# Patient Record
Sex: Female | Born: 1985 | Race: Black or African American | Hispanic: No | Marital: Single | Smoking: Never smoker
Health system: Southern US, Community
[De-identification: ages and names within clinical notes are randomized; demographics above are authoritative.]

## PROBLEM LIST (undated history)

## (undated) DIAGNOSIS — A568 Sexually transmitted chlamydial infection of other sites: Secondary | ICD-10-CM

## (undated) DIAGNOSIS — O98319 Other infections with a predominantly sexual mode of transmission complicating pregnancy, unspecified trimester: Secondary | ICD-10-CM

## (undated) DIAGNOSIS — IMO0002 Reserved for concepts with insufficient information to code with codable children: Secondary | ICD-10-CM

## (undated) DIAGNOSIS — R87619 Unspecified abnormal cytological findings in specimens from cervix uteri: Secondary | ICD-10-CM

## (undated) DIAGNOSIS — D649 Anemia, unspecified: Secondary | ICD-10-CM

## (undated) HISTORY — DX: Unspecified abnormal cytological findings in specimens from cervix uteri: R87.619

## (undated) HISTORY — DX: Sexually transmitted chlamydial infection of other sites: A56.8

## (undated) HISTORY — DX: Reserved for concepts with insufficient information to code with codable children: IMO0002

## (undated) HISTORY — DX: Anemia, unspecified: D64.9

## (undated) HISTORY — DX: Other infections with a predominantly sexual mode of transmission complicating pregnancy, unspecified trimester: O98.319

---

## 2008-07-30 ENCOUNTER — Ambulatory Visit: Payer: Self-pay | Admitting: Obstetrics and Gynecology

## 2008-07-30 ENCOUNTER — Encounter: Payer: Self-pay | Admitting: Family Medicine

## 2008-07-30 LAB — CONVERTED CEMR LAB
Antibody Screen: NEGATIVE
Eosinophils Relative: 5 % (ref 0–5)
Hemoglobin: 13.5 g/dL (ref 12.0–15.0)
Hgb A2 Quant: 2.8 % (ref 2.2–3.2)
Hgb A: 97.2 % (ref 96.8–97.8)
Hgb F Quant: 0 % (ref 0.0–2.0)
Hgb S Quant: 0 % (ref 0.0–0.0)
Monocytes Relative: 9 % (ref 3–12)
Platelets: 385 10*3/uL (ref 150–400)
RBC: 4.31 M/uL (ref 3.87–5.11)
WBC: 6.7 10*3/uL (ref 4.0–10.5)

## 2008-07-31 ENCOUNTER — Ambulatory Visit (HOSPITAL_COMMUNITY): Admission: RE | Admit: 2008-07-31 | Discharge: 2008-07-31 | Payer: Self-pay | Admitting: Obstetrics & Gynecology

## 2008-08-25 ENCOUNTER — Other Ambulatory Visit: Admission: RE | Admit: 2008-08-25 | Discharge: 2008-08-25 | Payer: Self-pay | Admitting: Obstetrics & Gynecology

## 2008-08-25 ENCOUNTER — Encounter: Payer: Self-pay | Admitting: Obstetrics & Gynecology

## 2008-08-25 ENCOUNTER — Ambulatory Visit: Payer: Self-pay | Admitting: Obstetrics & Gynecology

## 2008-08-25 DIAGNOSIS — A568 Sexually transmitted chlamydial infection of other sites: Secondary | ICD-10-CM

## 2008-08-25 HISTORY — DX: Sexually transmitted chlamydial infection of other sites: A56.8

## 2008-08-26 ENCOUNTER — Encounter: Payer: Self-pay | Admitting: Family Medicine

## 2008-08-26 LAB — CONVERTED CEMR LAB
Trich, Wet Prep: NONE SEEN
Yeast Wet Prep HPF POC: NONE SEEN

## 2008-09-09 ENCOUNTER — Encounter: Payer: Self-pay | Admitting: Family Medicine

## 2008-09-09 ENCOUNTER — Ambulatory Visit: Payer: Self-pay | Admitting: Obstetrics and Gynecology

## 2008-09-22 ENCOUNTER — Ambulatory Visit (HOSPITAL_COMMUNITY): Admission: RE | Admit: 2008-09-22 | Discharge: 2008-09-22 | Payer: Self-pay | Admitting: Obstetrics & Gynecology

## 2008-09-22 ENCOUNTER — Ambulatory Visit: Payer: Self-pay | Admitting: Obstetrics and Gynecology

## 2008-09-22 IMAGING — US US OB DETAIL+14 WK
1 series · 14 of 28 positions shown · non-contrast
Comparison: none

OBSTETRICAL ULTRASOUND:
 This ultrasound exam was performed in the [HOSPITAL] Ultrasound Department.  The OB US report was generated in the AS system, and faxed to the ordering physician.  This report is also available in [REDACTED] PACS.

[Series 1: us ob detail +14 wk · 14 of 91 slices shown]
[im 4/91]
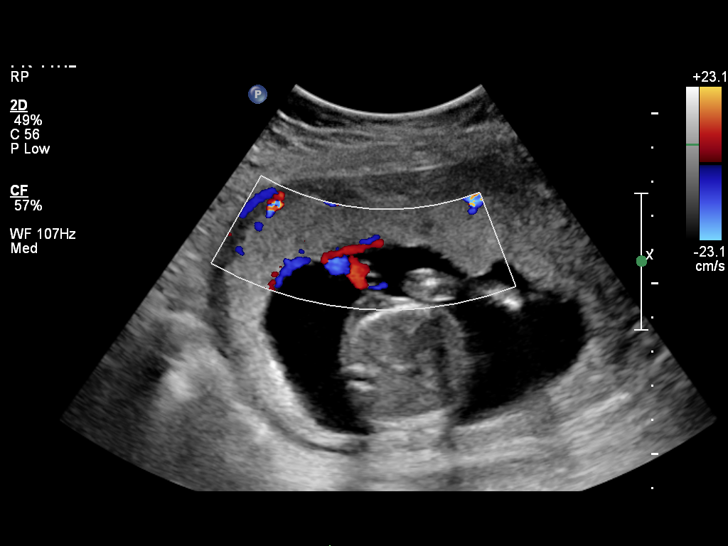
[im 11/91]
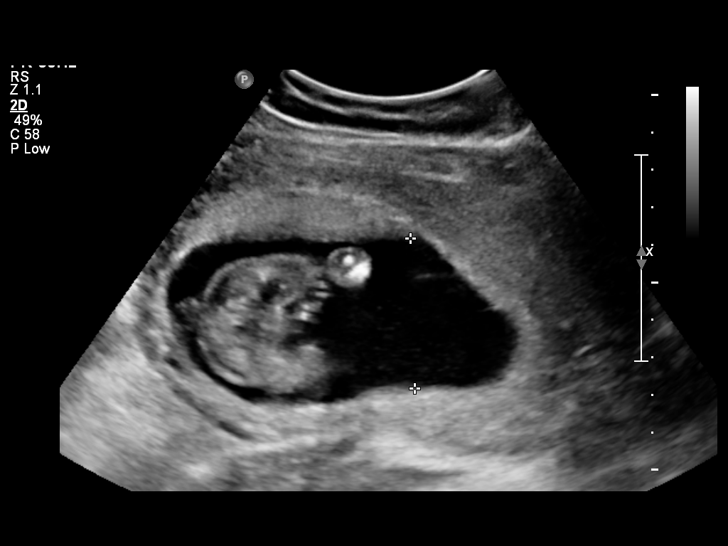
[im 17/91]
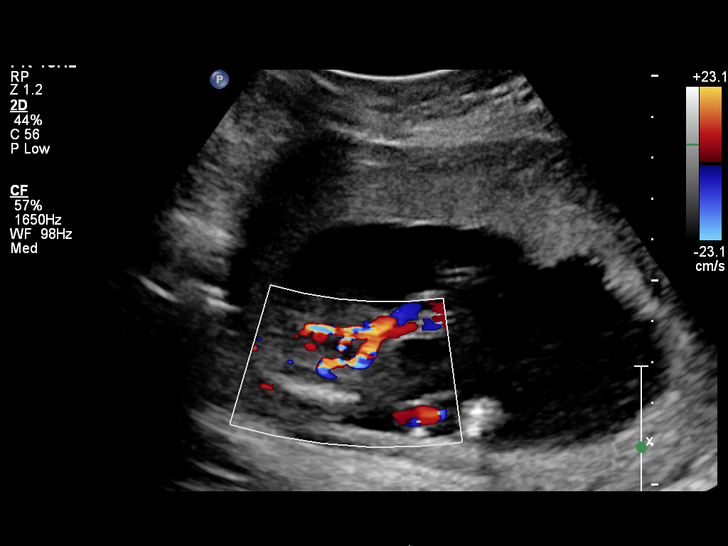
[im 24/91]
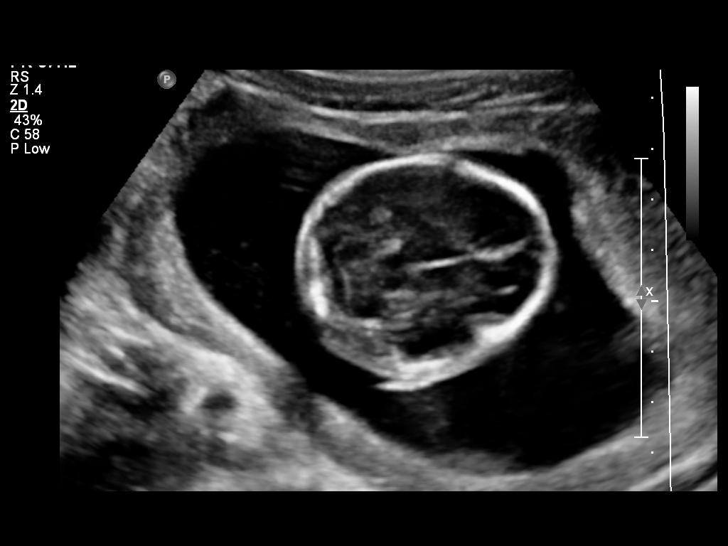
[im 31/91]
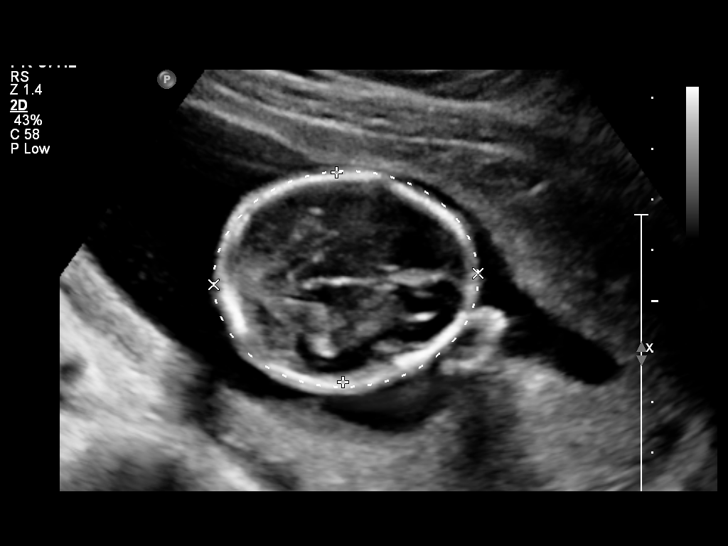
[im 37/91]
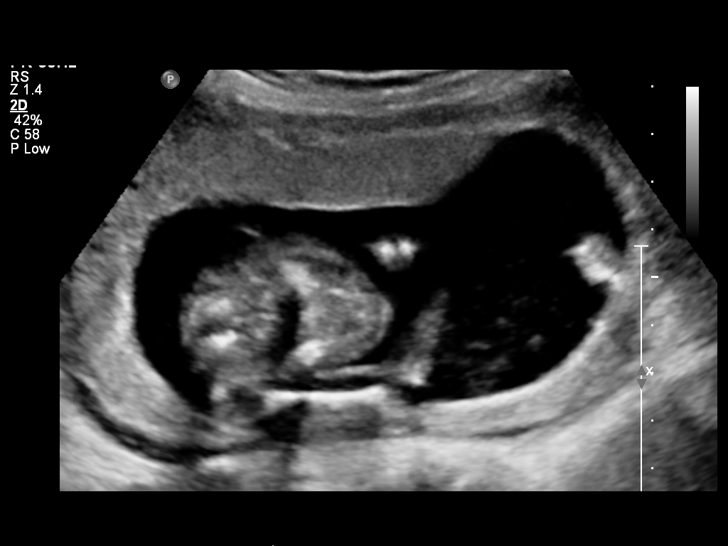
[im 44/91]
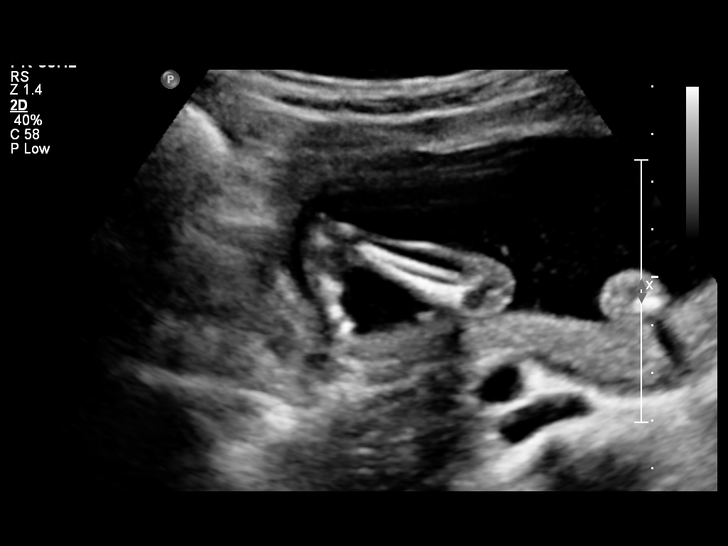
[im 51/91]
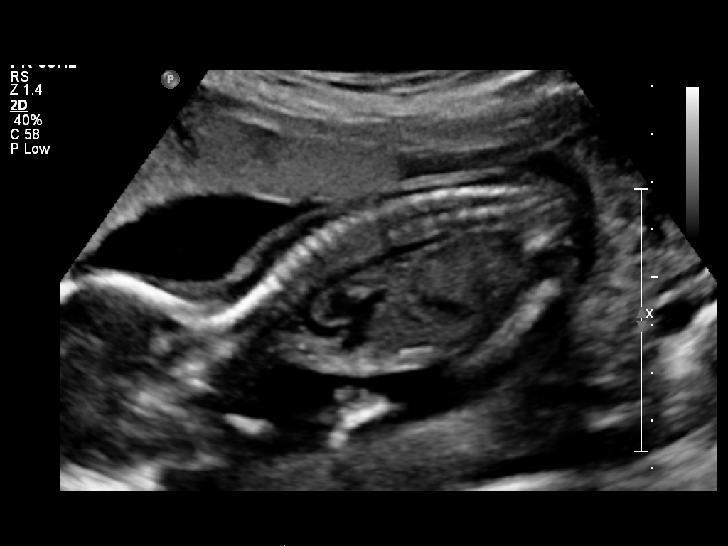
[im 57/91]
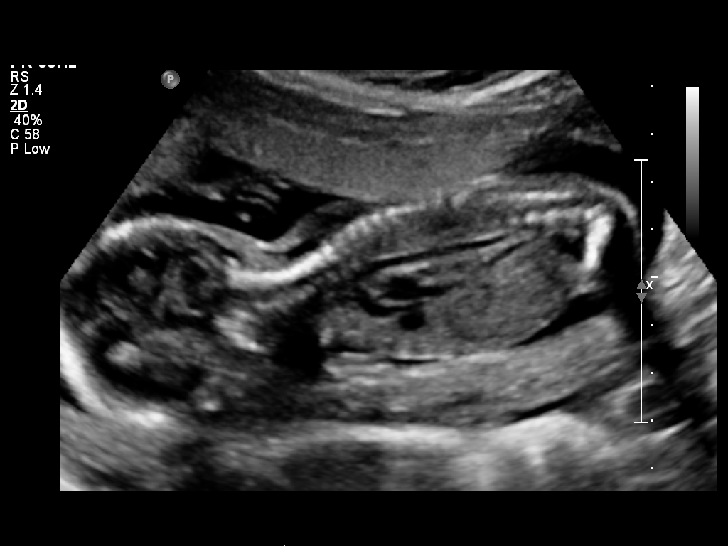
[im 64/91]
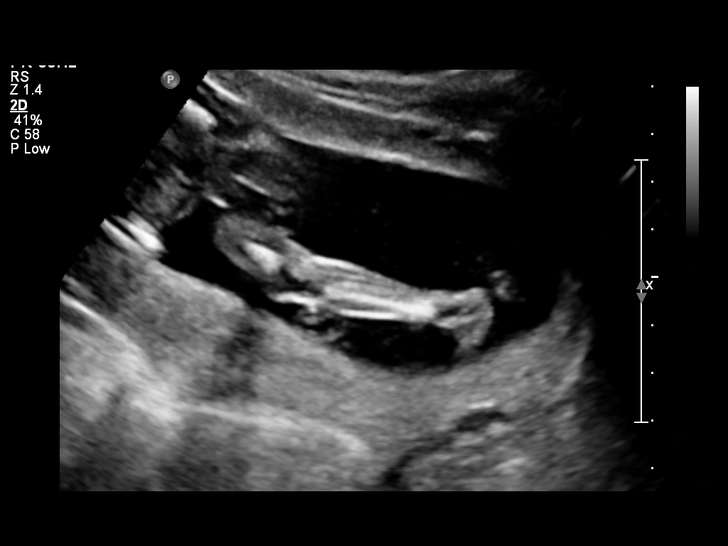
[im 71/91]
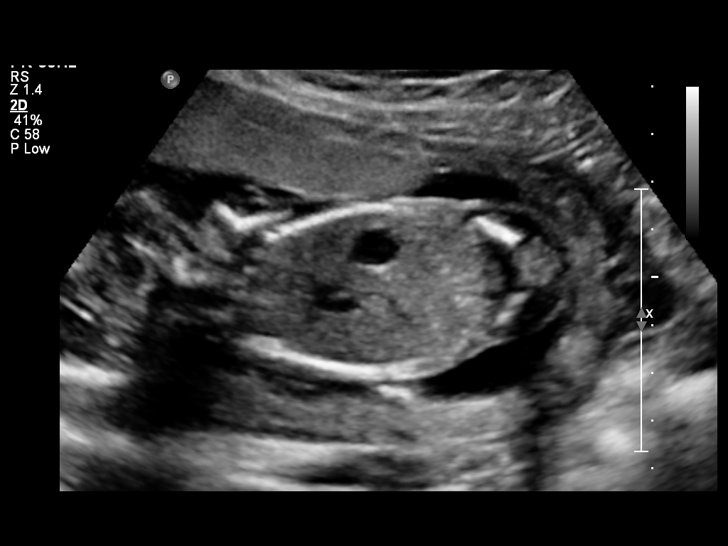
[im 77/91]
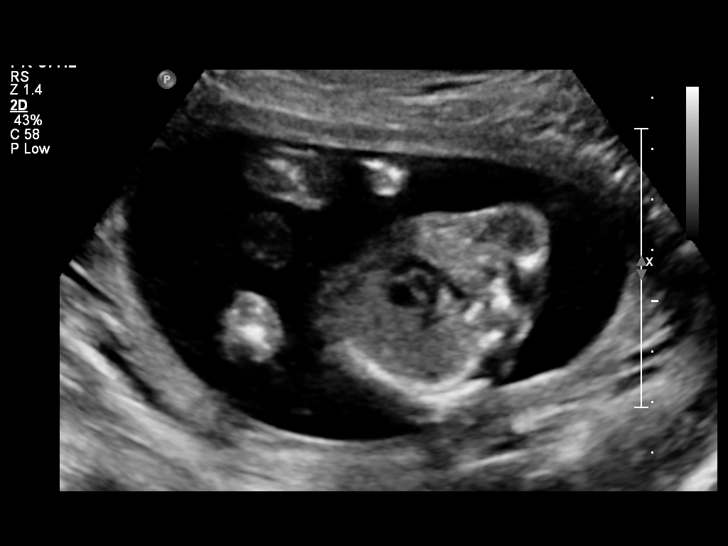
[im 84/91]
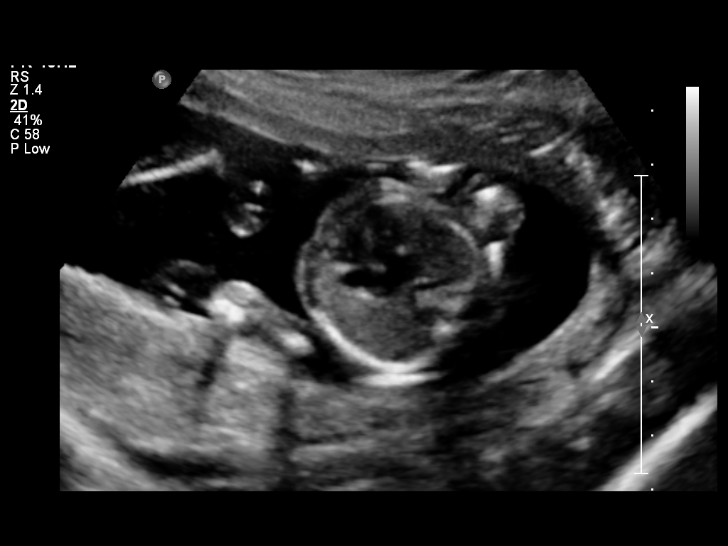
[im 91/91]
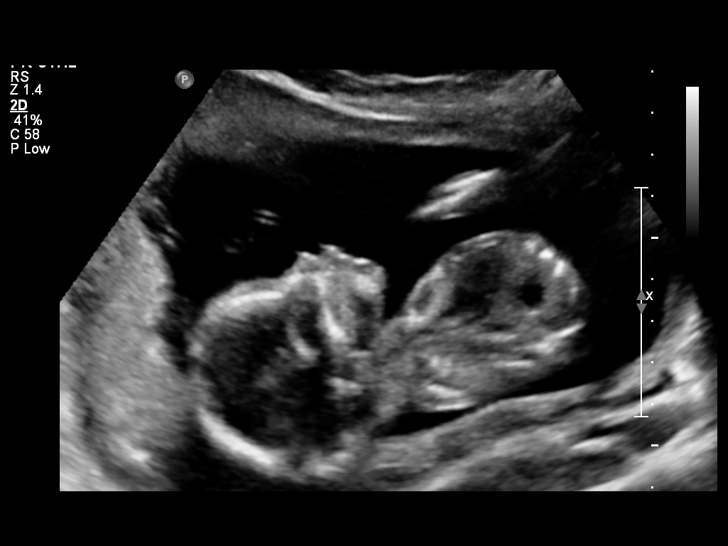

[14 of 28 positions shown; findings below may reference images not displayed]

IMPRESSION: See AS Obstetric US report.

## 2008-09-29 ENCOUNTER — Ambulatory Visit (HOSPITAL_COMMUNITY): Admission: RE | Admit: 2008-09-29 | Discharge: 2008-09-29 | Payer: Self-pay | Admitting: Obstetrics & Gynecology

## 2008-10-10 ENCOUNTER — Emergency Department: Payer: Self-pay | Admitting: Emergency Medicine

## 2008-10-16 ENCOUNTER — Ambulatory Visit (HOSPITAL_COMMUNITY): Admission: RE | Admit: 2008-10-16 | Discharge: 2008-10-16 | Payer: Self-pay | Admitting: Obstetrics & Gynecology

## 2008-10-20 ENCOUNTER — Ambulatory Visit: Payer: Self-pay | Admitting: Obstetrics and Gynecology

## 2008-11-13 ENCOUNTER — Ambulatory Visit (HOSPITAL_COMMUNITY): Admission: RE | Admit: 2008-11-13 | Discharge: 2008-11-13 | Payer: Self-pay | Admitting: Obstetrics & Gynecology

## 2008-11-17 ENCOUNTER — Ambulatory Visit: Payer: Self-pay | Admitting: Family Medicine

## 2008-12-01 ENCOUNTER — Ambulatory Visit: Payer: Self-pay | Admitting: Obstetrics & Gynecology

## 2008-12-01 ENCOUNTER — Encounter: Payer: Self-pay | Admitting: Family Medicine

## 2008-12-01 LAB — CONVERTED CEMR LAB
HCT: 36.2 % (ref 36.0–46.0)
Hemoglobin: 12 g/dL (ref 12.0–15.0)
MCV: 95.5 fL (ref 78.0–100.0)
Platelets: 281 10*3/uL (ref 150–400)
WBC: 10.2 10*3/uL (ref 4.0–10.5)

## 2008-12-11 ENCOUNTER — Ambulatory Visit (HOSPITAL_COMMUNITY): Admission: RE | Admit: 2008-12-11 | Discharge: 2008-12-11 | Payer: Self-pay | Admitting: Obstetrics & Gynecology

## 2008-12-23 ENCOUNTER — Ambulatory Visit: Payer: Self-pay | Admitting: Obstetrics and Gynecology

## 2009-01-06 ENCOUNTER — Ambulatory Visit: Payer: Self-pay | Admitting: Obstetrics and Gynecology

## 2009-01-08 ENCOUNTER — Ambulatory Visit (HOSPITAL_COMMUNITY): Admission: RE | Admit: 2009-01-08 | Discharge: 2009-01-08 | Payer: Self-pay | Admitting: Obstetrics & Gynecology

## 2009-01-27 ENCOUNTER — Ambulatory Visit: Payer: Self-pay | Admitting: Obstetrics & Gynecology

## 2009-01-27 ENCOUNTER — Encounter: Payer: Self-pay | Admitting: Family Medicine

## 2009-01-27 LAB — CONVERTED CEMR LAB: GC Probe Amp, Genital: NEGATIVE

## 2009-01-28 ENCOUNTER — Encounter: Payer: Self-pay | Admitting: Family Medicine

## 2009-02-03 ENCOUNTER — Ambulatory Visit: Payer: Self-pay | Admitting: Obstetrics and Gynecology

## 2009-02-05 ENCOUNTER — Inpatient Hospital Stay (HOSPITAL_COMMUNITY): Admission: AD | Admit: 2009-02-05 | Discharge: 2009-02-05 | Payer: Self-pay | Admitting: Obstetrics & Gynecology

## 2009-02-10 ENCOUNTER — Ambulatory Visit: Payer: Self-pay | Admitting: Family Medicine

## 2009-02-18 ENCOUNTER — Ambulatory Visit: Payer: Self-pay | Admitting: Obstetrics & Gynecology

## 2009-02-18 ENCOUNTER — Inpatient Hospital Stay (HOSPITAL_COMMUNITY): Admission: RE | Admit: 2009-02-18 | Discharge: 2009-02-21 | Payer: Self-pay | Admitting: Obstetrics & Gynecology

## 2009-03-08 ENCOUNTER — Emergency Department: Payer: Self-pay | Admitting: Emergency Medicine

## 2009-03-10 ENCOUNTER — Ambulatory Visit: Payer: Self-pay | Admitting: Obstetrics and Gynecology

## 2009-04-01 ENCOUNTER — Other Ambulatory Visit: Admission: RE | Admit: 2009-04-01 | Discharge: 2009-04-01 | Payer: Self-pay | Admitting: Obstetrics and Gynecology

## 2009-04-01 ENCOUNTER — Encounter: Payer: Self-pay | Admitting: Obstetrics and Gynecology

## 2009-04-01 ENCOUNTER — Ambulatory Visit: Payer: Self-pay | Admitting: Obstetrics and Gynecology

## 2009-04-01 LAB — CONVERTED CEMR LAB
Platelets: 332 10*3/uL (ref 150–400)
RBC: 4.24 M/uL (ref 3.87–5.11)
RDW: 12.4 % (ref 11.5–15.5)

## 2009-05-17 ENCOUNTER — Ambulatory Visit: Payer: Self-pay | Admitting: Obstetrics and Gynecology

## 2009-08-10 ENCOUNTER — Ambulatory Visit: Payer: Self-pay | Admitting: Obstetrics and Gynecology

## 2009-11-01 ENCOUNTER — Ambulatory Visit: Payer: Self-pay | Admitting: Obstetrics and Gynecology

## 2010-01-24 ENCOUNTER — Ambulatory Visit: Payer: Self-pay | Admitting: Obstetrics and Gynecology

## 2010-04-25 ENCOUNTER — Other Ambulatory Visit: Admission: RE | Admit: 2010-04-25 | Discharge: 2010-04-25 | Payer: Self-pay | Admitting: Obstetrics and Gynecology

## 2010-04-25 ENCOUNTER — Ambulatory Visit: Payer: Self-pay | Admitting: Obstetrics and Gynecology

## 2010-07-25 ENCOUNTER — Ambulatory Visit: Payer: Self-pay | Admitting: Obstetrics and Gynecology

## 2010-10-17 ENCOUNTER — Ambulatory Visit: Admit: 2010-10-17 | Payer: Self-pay | Admitting: Obstetrics and Gynecology

## 2010-10-20 ENCOUNTER — Ambulatory Visit: Payer: Medicaid Other

## 2010-10-20 ENCOUNTER — Ambulatory Visit: Payer: Self-pay

## 2010-10-20 DIAGNOSIS — Z3049 Encounter for surveillance of other contraceptives: Secondary | ICD-10-CM

## 2010-12-26 LAB — CBC
HCT: 32.1 % — ABNORMAL LOW (ref 36.0–46.0)
Hemoglobin: 11.4 g/dL — ABNORMAL LOW (ref 12.0–15.0)
MCHC: 34.9 g/dL (ref 30.0–36.0)
MCHC: 35.6 g/dL (ref 30.0–36.0)
MCV: 99.2 fL (ref 78.0–100.0)
Platelets: 190 10*3/uL (ref 150–400)
Platelets: 201 10*3/uL (ref 150–400)
RDW: 13 % (ref 11.5–15.5)
WBC: 10.5 10*3/uL (ref 4.0–10.5)
WBC: 7.1 10*3/uL (ref 4.0–10.5)

## 2010-12-26 LAB — TYPE AND SCREEN

## 2010-12-26 LAB — RPR: RPR Ser Ql: NONREACTIVE

## 2010-12-27 LAB — URINALYSIS, ROUTINE W REFLEX MICROSCOPIC
Bilirubin Urine: NEGATIVE
Glucose, UA: NEGATIVE mg/dL
Hgb urine dipstick: NEGATIVE
Ketones, ur: NEGATIVE mg/dL
Nitrite: NEGATIVE
Protein, ur: NEGATIVE mg/dL
Specific Gravity, Urine: 1.015 (ref 1.005–1.030)
Urobilinogen, UA: 2 mg/dL — ABNORMAL HIGH (ref 0.0–1.0)
pH: 7 (ref 5.0–8.0)

## 2010-12-27 LAB — WET PREP, GENITAL
Trich, Wet Prep: NONE SEEN
Yeast Wet Prep HPF POC: NONE SEEN

## 2010-12-27 LAB — URINE MICROSCOPIC-ADD ON

## 2011-01-09 ENCOUNTER — Ambulatory Visit: Payer: Medicaid Other

## 2011-01-09 DIAGNOSIS — Z3049 Encounter for surveillance of other contraceptives: Secondary | ICD-10-CM

## 2011-01-31 NOTE — Op Note (Signed)
NAMEBRAYLEIGH, Monique Stone              ACCOUNT NO.:  [id number]   MEDICAL RECORD NO.:  [id number]          PATIENT TYPE:  INP   LOCATION:  9120                          FACILITY:  [facility]   PHYSICIAN:  Lesly Dukes, M.D. DATE OF BIRTH:  06-17-1986   DATE OF PROCEDURE:  02/18/2009  DATE OF DISCHARGE:                               OPERATIVE REPORT   PREOPERATIVE DIAGNOSES:  1. Intrauterine pregnancy at 39-0/7 weeks' gestational age.  2. History of previous cesarean section.   POSTOPERATIVE DIAGNOSES:  1. Intrauterine pregnancy at 39-0/7 weeks' gestational age.  2. History of previous cesarean section.   PROCEDURE:  Repeat low transverse cesarean section.   SURGEON:  Lesly Dukes, MD   ASSISTANT:  Odie Sera, DO   ANESTHESIA:  Spinal.   INDICATIONS FOR PROCEDURE:  Ms. Monique Stone is a 25 year old gravida  2 now para 2-0-0-2 at 39-0/7 weeks with history of previous cesarean.  He has previously been counseled on risks and benefits of repeat  cesarean section to include but not limited to bleeding, infection, and  damage to intra-abdominal organs.  The patient voiced understanding  these risks and desires to proceed with surgery.   DESCRIPTION OF PROCEDURE:  The patient was taken to operating room where  spinal anesthesia was introduced.  She was then prepped and draped in  the usual sterile manner and placed in left dorsal supine position.  Time-out was conducted.  Appropriate anesthesia was confirmed.  A  Pfannenstiel incision was made in the skin and extended through the  subcutaneous layers down to the fascia.  The fascia was then incised in  the midline.  The fascial incision was extended laterally using the Mayo  scissors.  The fascia was then bluntly and sharply dissected off the  underlying rectus muscles.  The rectus muscles were then separated in  the midline and the opening was extended using manual traction.  The  peritoneal opening was extended using  electrocautery and peritoneal  layer.  The left rectus muscles were also dissected bluntly with  electrocautery at the midline position approximately one-quarter the way  across.  Several omental adhesions were noted to the anterior abdominal  wall.  These were clamped with Kelly clamp and ligated with sutures and  then sharply dissected.  An appropriate opening to the uterus was then  obtained and the bladder blade was placed.  A transverse incision was  made in the lower uterine segment with the scalpel extended through the  myometrial layers.  The bulging membranes were noted.  The uterine  incision was then extended laterally using manual traction.  The  membranes ruptured bluntly and meconium-stained amniotic fluid was  noted.  The fetal head was grasped and elevated out of the pelvis.  The  bladder blade was removed and fetal head was delivered with the  assistance of fundal pressure.  The mouth and nares were then bulb  suctioned and the shoulders followed by the rest of corpus were  delivered without difficulty.  The cord was then clamped and cut and the  baby handed to the awaiting NICU staff with  a spontaneous cry, good  color, and good tone.  The placenta was then delivered with the  assistance of fundal massage.  The placenta was intact and had 3-vessel  cord.  The uterus was then cleared of clots and debris using dry lap  sponge.  The uterine incision was then closed using 0 Vicryl in a  running interlocking fashion.  A figure-of-eight suture was needed for  some persistent oozing.  Good hemostasis was noted of the uterine  incision.  Both fallopian tubes and ovaries were identified and found to  be grossly normal.  The peritoneum was then closed using 0 Vicryl in a  running non interlocking fashion.  The fascia was then closed using 0  Vicryl in a running noninterlocking fashion.  Good closure of the fascia  was noted and no defects were noted.  There was slight oozing of   subcutaneous layers which were treated with electrocautery.  Then, good  hemostasis was noted.  The subcutaneous tissue was irrigated with wet  lap sponge.  The skin was then closed with staples in the usual manner.  Pressure dressing was applied.   FINDINGS:  1. Meconium-stained amniotic fluid.  2. Viable female infant.  3. Grossly normal fallopian tubes and ovaries bilaterally.   SPECIMEN:  Placenta.   DISPOSITION:  To Labor and Delivery.   ESTIMATED BLOOD LOSS:  700 mL.   There were no immediate complications.  The patient was taken to PACU in  good condition.  All sponge, needle, and instrument counts were correct  x2.      Odie Sera, DO  Electronically Signed     ______________________________  Lesly Dukes, M.D.    MC/MEDQ  D:  02/18/2009  T:  02/19/2009  Job:  [id number]

## 2011-01-31 NOTE — Assessment & Plan Note (Signed)
Monique Stone, Monique Stone              ACCOUNT NO.:  [id number]   MEDICAL RECORD NO.:  [id number]          PATIENT TYPE:  POB   LOCATION:  [facility]         FACILITY:  [facility]   PHYSICIAN:  Argentina Donovan, MD        DATE OF BIRTH:  10/03/1985   DATE OF SERVICE:  04/25/2010                                  CLINIC NOTE   The patient is a 25 year old African American female gravida 2, para 2-0-  0-2 with a child 63-year-old who came in for her annual GYN examination  and her shot of Depo-Provera.  She has been on Depo-Provera since the  birth of her baby a year ago, has been encouraged to take calcium with  vitamin D, has had no complaints.  No physical problems.  She has no  known allergies today in for her routine GYN exam.   The patient is 4 feet 11 inches tall, weighs 138.  Her blood pressure is  107/73 and a pulse of 86.  The neck is supple.  Thyroid is symmetrical,  no dominant masses.  The breasts are symmetrical.  No dominant masses.  No nipple discharge.  No supraclavicular, no axillary nodes.  The  abdomen is soft, flat, nontender.  No masses, no organomegaly.  External  genitalia is normal.  BUS within normal limits.  Vagina is clean and  well rugated with some redundant wall tissue.  The cervix is clean and  parous.  Pap smear was taken.  There is no sign of any abnormal  discharge and the patient has had no complaints of that.  Bimanual  examination, the uterus is anterior with normal size, shape, and  consistency.  Even the patient is small, I could not really palpate the  ovaries very well.   IMPRESSION:  However, is normal gynecological examination.  The patient  will return in 3 months for another shot of Depo-Provera.           ______________________________  Argentina Donovan, MD     PR/MEDQ  D:  04/25/2010  T:  04/26/2010  Job:  [id number]

## 2011-01-31 NOTE — Discharge Summary (Signed)
Monique Stone, Monique Stone              ACCOUNT NO.:  [id number]   MEDICAL RECORD NO.:  [id number]          PATIENT TYPE:  INP   LOCATION:  9120                          FACILITY:  [facility]   PHYSICIAN:  Lesly Dukes, M.D. DATE OF BIRTH:  [birth year]   DATE OF ADMISSION:  02/18/2009  DATE OF DISCHARGE:  02/21/2009                               DISCHARGE SUMMARY   REASON FOR ADMISSION:  Pregnancy at 39 weeks for a scheduled repeat low  transverse cesarean section by Dr. Penne Lash.   DISCHARGE DIAGNOSIS:  Pregnancy at 39 weeks, delivered viable female  infant by repeat low transverse cesarean section.   HOSPITAL COURSE:  Uneventful.  The patient is ambulating well.  Taking  p.o. fluids and solids well.  She has not had a bowel movement yet, but  is passing gas rectally, and pain is managed well with Motrin and  Percocet.   DISCHARGE MEDICATIONS:  1. Percocet 5/325 one p.o. q.4 h. p.r.n. pain.  2. Motrin 600 q.6 h. p.r.n. cramping.  3. Colace 100 mg b.i.d.  4. FeSO4 b.i.d.  5. She is to continue her prenatal vitamin.   DISCHARGE INSTRUCTIONS:  She is to go to the office at [facility] on  Wednesday or Thursday to get her staples removed.  She desires Depo  which will be given 150 mg IM prior to discharge.      Zerita Boers, N.M.      Lesly Dukes, M.D.  Electronically Signed    DL/MEDQ  D:  21/30/8657  T:  02/22/2009  Job:  [id number]   cc:   Lesly Dukes, M.D.

## 2011-04-06 ENCOUNTER — Encounter: Payer: Self-pay | Admitting: Gynecology

## 2011-04-10 ENCOUNTER — Ambulatory Visit (INDEPENDENT_AMBULATORY_CARE_PROVIDER_SITE_OTHER): Payer: Medicaid Other | Admitting: *Deleted

## 2011-04-10 ENCOUNTER — Other Ambulatory Visit (HOSPITAL_COMMUNITY)
Admission: RE | Admit: 2011-04-10 | Discharge: 2011-04-10 | Disposition: A | Discharge status: Home / Self Care | Payer: Medicaid Other | Source: Ambulatory Visit | Attending: Obstetrics and Gynecology | Admitting: Obstetrics and Gynecology

## 2011-04-10 ENCOUNTER — Encounter: Payer: Self-pay | Admitting: Obstetrics and Gynecology

## 2011-04-10 VITALS — BP 88/59 | HR 75 | Ht 59.0 in | Wt 120.0 lb

## 2011-04-10 DIAGNOSIS — Z01419 Encounter for gynecological examination (general) (routine) without abnormal findings: Secondary | ICD-10-CM | POA: Insufficient documentation

## 2011-04-10 DIAGNOSIS — Z3049 Encounter for surveillance of other contraceptives: Secondary | ICD-10-CM

## 2011-04-10 MED ORDER — MEDROXYPROGESTERONE ACETATE 150 MG/ML IM SUSP: 150.0000 mg | INTRAMUSCULAR | Status: DC

## 2011-04-10 MED ORDER — MEDROXYPROGESTERONE ACETATE 150 MG/ML IM SUSP
150.0000 mg | INTRAMUSCULAR | Status: DC
  Administered 2011-04-10: 150 mg via INTRAMUSCULAR

## 2011-04-10 NOTE — Progress Notes (Signed)
Addended by: Barbara Cower on: 04/10/2011 03:19 PM   Modules accepted: Orders

## 2011-04-10 NOTE — Progress Notes (Signed)
Addended by: Barbara Cower on: 04/10/2011 03:27 PM   Modules accepted: Orders

## 2011-04-10 NOTE — Progress Notes (Signed)
  Subjective:     Monique Stone is a 25 y.o. female and is here for a comprehensive physical exam. The patient reports no problems. Patient is currently using Depo-Provera for birth control and is content with it. Patient is without complaints  History   Social History  . Marital Status: Single    Spouse Name: N/A    Number of Children: N/A  . Years of Education: N/A   Occupational History  . Not on file.   Social History Main Topics  . Smoking status: Not on file  . Smokeless tobacco: Never Used  . Alcohol Use: No  . Drug Use: No  . Sexually Active: Not on file   Other Topics Concern  . Not on file   Social History Narrative  . No narrative on file   Health Maintenance  Topic Date Due  . Pap Smear  01/25/2005  . Tetanus/tdap  01/25/2006    The following portions of the patient's history were reviewed and updated as appropriate: allergies, current medications, past family history, past medical history, past social history, past surgical history and problem list.  Review of Systems A comprehensive review of systems was negative.   Objective:    General appearance: alert and no distress Neck: thyroid not enlarged, symmetric, no tenderness/mass/nodules Lungs: clear to auscultation bilaterally Breasts: normal appearance, no masses or tenderness, No nipple retraction or dimpling, No nipple discharge or bleeding, No axillary or supraclavicular adenopathy, Normal to palpation without dominant masses Heart: regular rate and rhythm, S1, S2 normal, no murmur, click, rub or gallop Abdomen: soft, non-tender; bowel sounds normal; no masses,  no organomegaly Pelvic: cervix normal in appearance, external genitalia normal, no adnexal masses or tenderness, no cervical motion tenderness, uterus normal size, shape, and consistency and vagina normal without discharge Extremities: no edema, redness or tenderness in the calves or thighs    Assessment:    Healthy female exam.      Plan:     Pap smear and cultures were performed -patient desires full STD testing -Patient to continue with Depo-Provera and condom use for STD prevention -Patient will be contacted with any abnormal results -Return in a year or prn See After Visit Summary for Counseling Recommendations

## 2011-04-10 NOTE — Progress Notes (Signed)
Addended by: Catalina Antigua on: 04/10/2011 03:05 PM   Modules accepted: Orders

## 2011-04-10 NOTE — Progress Notes (Signed)
No concerns, here for yearly exam and needs Depo Provera injection

## 2011-04-11 LAB — GC/CHLAMYDIA PROBE AMP, GENITAL

## 2011-04-11 LAB — HIV ANTIBODY (ROUTINE TESTING W REFLEX): HIV: NONREACTIVE

## 2011-05-03 ENCOUNTER — Encounter: Payer: Self-pay | Admitting: Obstetrics and Gynecology

## 2011-07-12 ENCOUNTER — Ambulatory Visit: Payer: Medicaid Other

## 2011-07-17 ENCOUNTER — Ambulatory Visit (INDEPENDENT_AMBULATORY_CARE_PROVIDER_SITE_OTHER): Payer: Medicaid Other | Admitting: Obstetrics & Gynecology

## 2011-07-17 ENCOUNTER — Ambulatory Visit: Payer: Medicaid Other

## 2011-07-17 DIAGNOSIS — IMO0001 Reserved for inherently not codable concepts without codable children: Secondary | ICD-10-CM

## 2011-07-17 DIAGNOSIS — Z3049 Encounter for surveillance of other contraceptives: Secondary | ICD-10-CM

## 2011-07-17 DIAGNOSIS — Z23 Encounter for immunization: Secondary | ICD-10-CM

## 2011-07-17 MED ORDER — MEDROXYPROGESTERONE ACETATE 150 MG/ML IM SUSP
150.0000 mg | Freq: Once | INTRAMUSCULAR | Status: DC
  Administered 2011-07-17: 150 mg via INTRAMUSCULAR

## 2011-07-17 NOTE — Progress Notes (Signed)
Patient is here today for Depo Provera.  Flu vaccine was ordered but patient does not wish to receive this today.

## 2011-08-30 ENCOUNTER — Emergency Department: Payer: Self-pay | Admitting: Unknown Physician Specialty

## 2011-09-25 ENCOUNTER — Ambulatory Visit (INDEPENDENT_AMBULATORY_CARE_PROVIDER_SITE_OTHER): Payer: Medicaid Other | Admitting: Gynecology

## 2011-09-25 DIAGNOSIS — Z309 Encounter for contraceptive management, unspecified: Secondary | ICD-10-CM

## 2011-09-25 DIAGNOSIS — Z3049 Encounter for surveillance of other contraceptives: Secondary | ICD-10-CM

## 2011-09-25 MED ORDER — MEDROXYPROGESTERONE ACETATE 150 MG/ML IM SUSP
150.0000 mg | Freq: Once | INTRAMUSCULAR | Status: AC
  Administered 2011-09-25: 150 mg via INTRAMUSCULAR

## 2011-12-11 ENCOUNTER — Ambulatory Visit (INDEPENDENT_AMBULATORY_CARE_PROVIDER_SITE_OTHER): Payer: Self-pay | Admitting: Gynecology

## 2011-12-11 DIAGNOSIS — Z309 Encounter for contraceptive management, unspecified: Secondary | ICD-10-CM

## 2011-12-11 DIAGNOSIS — Z3049 Encounter for surveillance of other contraceptives: Secondary | ICD-10-CM

## 2011-12-11 MED ORDER — MEDROXYPROGESTERONE ACETATE 150 MG/ML IM SUSP
150.0000 mg | Freq: Once | INTRAMUSCULAR | Status: AC
  Administered 2011-12-11: 150 mg via INTRAMUSCULAR

## 2012-03-11 ENCOUNTER — Ambulatory Visit (INDEPENDENT_AMBULATORY_CARE_PROVIDER_SITE_OTHER): Payer: Self-pay | Admitting: *Deleted

## 2012-03-11 DIAGNOSIS — Z309 Encounter for contraceptive management, unspecified: Secondary | ICD-10-CM

## 2012-03-11 DIAGNOSIS — Z3049 Encounter for surveillance of other contraceptives: Secondary | ICD-10-CM

## 2012-03-11 DIAGNOSIS — Z01419 Encounter for gynecological examination (general) (routine) without abnormal findings: Secondary | ICD-10-CM

## 2012-03-11 MED ORDER — NORGESTIMATE-ETH ESTRADIOL 0.25-35 MG-MCG PO TABS: 1.0000 | ORAL_TABLET | Freq: Every day | ORAL | Status: DC

## 2012-03-11 MED ORDER — MEDROXYPROGESTERONE ACETATE 150 MG/ML IM SUSP
150.0000 mg | Freq: Once | INTRAMUSCULAR | Status: AC
  Administered 2012-03-11: 150 mg via INTRAMUSCULAR

## 2012-03-11 NOTE — Progress Notes (Signed)
Patient would like to have this last Depo injection and then switch back to [facility] ortho tricyclen  Due to side effects of depo.

## 2012-04-15 ENCOUNTER — Encounter: Payer: Self-pay | Admitting: Obstetrics and Gynecology

## 2012-04-15 ENCOUNTER — Ambulatory Visit (INDEPENDENT_AMBULATORY_CARE_PROVIDER_SITE_OTHER): Payer: Medicaid Other | Admitting: Obstetrics and Gynecology

## 2012-04-15 ENCOUNTER — Other Ambulatory Visit (HOSPITAL_COMMUNITY)
Admission: RE | Admit: 2012-04-15 | Discharge: 2012-04-15 | Disposition: A | Discharge status: Home / Self Care | Payer: Medicaid Other | Source: Ambulatory Visit | Attending: Obstetrics and Gynecology | Admitting: Obstetrics and Gynecology

## 2012-04-15 VITALS — BP 111/79 | HR 93 | Ht 59.0 in | Wt 124.0 lb

## 2012-04-15 DIAGNOSIS — Z113 Encounter for screening for infections with a predominantly sexual mode of transmission: Secondary | ICD-10-CM | POA: Insufficient documentation

## 2012-04-15 DIAGNOSIS — Z01419 Encounter for gynecological examination (general) (routine) without abnormal findings: Secondary | ICD-10-CM | POA: Insufficient documentation

## 2012-04-15 NOTE — Patient Instructions (Signed)
Contraception Choices Birth control (contraception) can stop pregnancy from happening. Different types of birth control work in different ways. Some can:  Make the mucus in the cervix thick. This makes it hard for sperm to get into the uterus.   Thin the lining of the uterus. This makes it hard for an egg to attach to the wall of the uterus.   Stop the ovaries from releasing an egg.   Block the sperm from reaching the egg.  Certain types of surgery can stop pregnancy from happening. For women, the sugery closes the fallopian tubes (tubal ligation). For men, the surgery stops sperm from releasing during sex (vasectomy). HORMONAL BIRTH CONTROL Hormonal birth control stops pregnancy by putting hormones into your body. Types of birth control include:  A small tube put under the skin of the upper arm (implant). The tube can stay in place for 3 years.   Shots given every 3 months.   Pills taken every day or once after sex (intercourse).   Patches that are changed once a week.   A ring put into the vagina (vaginal ring). The ring is left in place for 3 weeks and removed for 1 week. Then, a new ring is put in the vagina.  BARRIER BIRTH CONTROL  Barrier birth control blocks sperm from reaching the egg. Types of birth control include:   A thin covering worn on the penis (female condom) during sex.   A soft, loose covering put into the vagina (female condom) before sex.   A rubber bowl that sits over the cervix (diaphragm). The bowl must be made for you. The bowl is put into the vagina before sex. The bowl is left in place for 6 to 8 hours after sex.   A small, soft cup that fits over the cervix (cervical cap). The cup must be made for you. The cup can be left in place for 48 hours after sex.   A sponge that is put into the vagina before sex.   A chemical that kills or blocks sperm from getting into the cervix and uterus (spermicide). The chemical may be a cream, jelly, foam, or pill.    INTRAUTERINE (IUD) BIRTH CONTROL  IUD birth control is a small, T-shaped piece of plastic. The plastic is put inside the uterus. There are 2 types of IUD:  Copper IUD. The IUD is covered in copper wire. The copper makes a fluid that kills sperm. It can stay in place for 10 years.   Hormone IUD. The hormone stops pregnancy from happening. It can stay in place for 5 years.  NATURAL FAMILY PLANNING BIRTH CONTROL  Natural family planning means not having sex or using barrier birth control when the woman is fertile. A woman can:  Use a calendar to keep track of when she is fertile.   Use a thermometer to measure her body temperature.  Protect yourself against sexual diseases no matter what type of birth control you use. Talk to your doctor about which type of birth control is best for you. Document Released: 07/02/2009 Document Revised: 08/24/2011 Document Reviewed: 01/11/2011 [facility] Patient Information 2012 [place], [place].  Preventive Care for Adults, Female A healthy lifestyle and preventive care can promote health and wellness. Preventive health guidelines for women include the following key practices.  A routine yearly physical is a good way to check with your caregiver about your health and preventive screening. It is a chance to share any concerns and updates on your health, and to  receive a thorough exam.   Visit your dentist for a routine exam and preventive care every 6 months. Brush your teeth twice a day and floss once a day. Good oral hygiene prevents tooth decay and gum disease.   The frequency of eye exams is based on your age, health, family medical history, use of contact lenses, and other factors. Follow your caregiver's recommendations for frequency of eye exams.   Eat a healthy diet. Foods like vegetables, fruits, whole grains, low-fat dairy products, and lean protein foods contain the nutrients you need without too many calories. Decrease your intake of foods high in  solid fats, added sugars, and salt. Eat the right amount of calories for you.Get information about a proper diet from your caregiver, if necessary.   Regular physical exercise is one of the most important things you can do for your health. Most adults should get at least 150 minutes of moderate-intensity exercise (any activity that increases your heart rate and causes you to sweat) each week. In addition, most adults need muscle-strengthening exercises on 2 or more days a week.   Maintain a healthy weight. The body mass index (BMI) is a screening tool to identify possible weight problems. It provides an estimate of body fat based on height and weight. Your caregiver can help determine your BMI, and can help you achieve or maintain a healthy weight.For adults 20 years and older:   A BMI below 18.5 is considered underweight.   A BMI of 18.5 to 24.9 is normal.   A BMI of 25 to 29.9 is considered overweight.   A BMI of 30 and above is considered obese.   Maintain normal blood lipids and cholesterol levels by exercising and minimizing your intake of saturated fat. Eat a balanced diet with plenty of fruit and vegetables. Blood tests for lipids and cholesterol should begin at age 20 and be repeated every 5 years. If your lipid or cholesterol levels are high, you are over 50, or you are at high risk for heart disease, you may need your cholesterol levels checked more frequently.Ongoing high lipid and cholesterol levels should be treated with medicines if diet and exercise are not effective.   If you smoke, find out from your caregiver how to quit. If you do not use tobacco, do not start.   If you are pregnant, do not drink alcohol. If you are breastfeeding, be very cautious about drinking alcohol. If you are not pregnant and choose to drink alcohol, do not exceed 1 drink per day. One drink is considered to be 12 ounces (355 mL) of beer, 5 ounces (148 mL) of wine, or 1.5 ounces (44 mL) of liquor.    Avoid use of street drugs. Do not share needles with anyone. Ask for help if you need support or instructions about stopping the use of drugs.   High blood pressure causes heart disease and increases the risk of stroke. Your blood pressure should be checked at least every 1 to 2 years. Ongoing high blood pressure should be treated with medicines if weight loss and exercise are not effective.   If you are 29 to 25 years old, ask your caregiver if you should take aspirin to prevent strokes.   Diabetes screening involves taking a blood sample to check your fasting blood sugar level. This should be done once every 3 years, after age 83, if you are within normal weight and without risk factors for diabetes. Testing should be considered at a younger age  or be carried out more frequently if you are overweight and have at least 1 risk factor for diabetes.   Breast cancer screening is essential preventive care for women. You should practice "breast self-awareness." This means understanding the normal appearance and feel of your breasts and may include breast self-examination. Any changes detected, no matter how small, should be reported to a caregiver. Women in their 85s and 30s should have a clinical breast exam (CBE) by a caregiver as part of a regular health exam every 1 to 3 years. After age 40, women should have a CBE every year. Starting at age 65, women should consider having a mammography (breast X-ray test) every year. Women who have a family history of breast cancer should talk to their caregiver about genetic screening. Women at a high risk of breast cancer should talk to their caregivers about having magnetic resonance imaging (MRI) and a mammography every year.   The Pap test is a screening test for cervical cancer. A Pap test can show cell changes on the cervix that might become cervical cancer if left untreated. A Pap test is a procedure in which cells are obtained and examined from the lower end  of the uterus (cervix).   Women should have a Pap test starting at age 32.   Between ages 46 and 62, Pap tests should be repeated every 2 years.   Beginning at age 55, you should have a Pap test every 3 years as long as the past 3 Pap tests have been normal.   Some women have medical problems that increase the chance of getting cervical cancer. Talk to your caregiver about these problems. It is especially important to talk to your caregiver if a new problem develops soon after your last Pap test. In these cases, your caregiver may recommend more frequent screening and Pap tests.   The above recommendations are the same for women who have or have not gotten the vaccine for human papillomavirus (HPV).   If you had a hysterectomy for a problem that was not cancer or a condition that could lead to cancer, then you no longer need Pap tests. Even if you no longer need a Pap test, a regular exam is a good idea to make sure no other problems are starting.   If you are between ages 73 and 54, and you have had normal Pap tests going back 10 years, you no longer need Pap tests. Even if you no longer need a Pap test, a regular exam is a good idea to make sure no other problems are starting.   If you have had past treatment for cervical cancer or a condition that could lead to cancer, you need Pap tests and screening for cancer for at least 20 years after your treatment.   If Pap tests have been discontinued, risk factors (such as a new sexual partner) need to be reassessed to determine if screening should be resumed.   The HPV test is an additional test that may be used for cervical cancer screening. The HPV test looks for the virus that can cause the cell changes on the cervix. The cells collected during the Pap test can be tested for HPV. The HPV test could be used to screen women aged 64 years and older, and should be used in women of any age who have unclear Pap test results. After the age of 14, women  should have HPV testing at the same frequency as a Pap test.  Colorectal cancer can be detected and often prevented. Most routine colorectal cancer screening begins at the age of 73 and continues through age 14. However, your caregiver may recommend screening at an earlier age if you have risk factors for colon cancer. On a yearly basis, your caregiver may provide home test kits to check for hidden blood in the stool. Use of a small camera at the end of a tube, to directly examine the colon (sigmoidoscopy or colonoscopy), can detect the earliest forms of colorectal cancer. Talk to your caregiver about this at age 95, when routine screening begins. Direct examination of the colon should be repeated every 5 to 10 years through age 30, unless early forms of pre-cancerous polyps or small growths are found.   Hepatitis C blood testing is recommended for all people born from [birth year] through 1965 and any individual with known risks for hepatitis C.   Practice safe sex. Use condoms and avoid high-risk sexual practices to reduce the spread of sexually transmitted infections (STIs). STIs include gonorrhea, chlamydia, syphilis, trichomonas, herpes, HPV, and human immunodeficiency virus (HIV). Herpes, HIV, and HPV are viral illnesses that have no cure. They can result in disability, cancer, and death. Sexually active women aged 6 and younger should be checked for chlamydia. Older women with new or multiple partners should also be tested for chlamydia. Testing for other STIs is recommended if you are sexually active and at increased risk.   Osteoporosis is a disease in which the bones lose minerals and strength with aging. This can result in serious bone fractures. The risk of osteoporosis can be identified using a bone density scan. Women ages 3 and over and women at risk for fractures or osteoporosis should discuss screening with their caregivers. Ask your caregiver whether you should take a calcium supplement or  vitamin D to reduce the rate of osteoporosis.   Menopause can be associated with physical symptoms and risks. Hormone replacement therapy is available to decrease symptoms and risks. You should talk to your caregiver about whether hormone replacement therapy is right for you.   Use sunscreen with sun protection factor (SPF) of 30 or more. Apply sunscreen liberally and repeatedly throughout the day. You should seek shade when your shadow is shorter than you. Protect yourself by wearing long sleeves, pants, a wide-brimmed hat, and sunglasses year round, whenever you are outdoors.   Once a month, do a whole body skin exam, using a mirror to look at the skin on your back. Notify your caregiver of new moles, moles that have irregular borders, moles that are larger than a pencil eraser, or moles that have changed in shape or color.   Stay current with required immunizations.   Influenza. You need a dose every fall (or winter). The composition of the flu vaccine changes each year, so being vaccinated once is not enough.   Pneumococcal polysaccharide. You need 1 to 2 doses if you smoke cigarettes or if you have certain chronic medical conditions. You need 1 dose at age 45 (or older) if you have never been vaccinated.   Tetanus, diphtheria, pertussis (Tdap, Td). Get 1 dose of Tdap vaccine if you are younger than age 31, are over 17 and have contact with an infant, are a Research scientist (physical sciences), are pregnant, or simply want to be protected from whooping cough. After that, you need a Td booster dose every 10 years. Consult your caregiver if you have not had at least 3 tetanus and diphtheria-containing shots sometime in your  life or have a deep or dirty wound.   HPV. You need this vaccine if you are a woman age 58 or younger. The vaccine is given in 3 doses over 6 months.   Measles, mumps, rubella (MMR). You need at least 1 dose of MMR if you were born in 1957 or later. You may also need a second dose.    Meningococcal. If you are age 53 to 59 and a first-year college student living in a residence hall, or have one of several medical conditions, you need to get vaccinated against meningococcal disease. You may also need additional booster doses.   Zoster (shingles). If you are age 26 or older, you should get this vaccine.   Varicella (chickenpox). If you have never had chickenpox or you were vaccinated but received only 1 dose, talk to your caregiver to find out if you need this vaccine.   Hepatitis A. You need this vaccine if you have a specific risk factor for hepatitis A virus infection or you simply wish to be protected from this disease. The vaccine is usually given as 2 doses, 6 to 18 months apart.   Hepatitis B. You need this vaccine if you have a specific risk factor for hepatitis B virus infection or you simply wish to be protected from this disease. The vaccine is given in 3 doses, usually over 6 months.  Preventive Services / Frequency Ages 38 to 47  Blood pressure check.** / Every 1 to 2 years.   Lipid and cholesterol check.** / Every 5 years beginning at age 59.   Clinical breast exam.** / Every 3 years for women in their 67s and 30s.   Pap test.** / Every 2 years from ages 12 through 70. Every 3 years starting at age 28 through age 64 or 39 with a history of 3 consecutive normal Pap tests.   HPV screening.** / Every 3 years from ages 35 through ages 21 to 21 with a history of 3 consecutive normal Pap tests.   Hepatitis C blood test.** / For any individual with known risks for hepatitis C.   Skin self-exam. / Monthly.   Influenza immunization.** / Every year.   Pneumococcal polysaccharide immunization.** / 1 to 2 doses if you smoke cigarettes or if you have certain chronic medical conditions.   Tetanus, diphtheria, pertussis (Tdap, Td) immunization. / A one-time dose of Tdap vaccine. After that, you need a Td booster dose every 10 years.   HPV immunization. / 3 doses  over 6 months, if you are 85 and younger.   Measles, mumps, rubella (MMR) immunization. / You need at least 1 dose of MMR if you were born in 1957 or later. You may also need a second dose.   Meningococcal immunization. / 1 dose if you are age 58 to 74 and a first-year college student living in a residence hall, or have one of several medical conditions, you need to get vaccinated against meningococcal disease. You may also need additional booster doses.   Varicella immunization.** / Consult your caregiver.   Hepatitis A immunization.** / Consult your caregiver. 2 doses, 6 to 18 months apart.   Hepatitis B immunization.** / Consult your caregiver. 3 doses usually over 6 months.  ** Family history and personal history of risk and conditions may change your caregiver's recommendations. Document Released: 10/31/2001 Document Revised: 08/24/2011 Document Reviewed: 01/30/2011 [facility] Patient Information 2012 [place], [place].

## 2012-04-15 NOTE — Progress Notes (Signed)
  Subjective:     Monique Stone is a 26 y.o. 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 The patient reports no problems. Patient is unhappy with depo-provera and is contemplating changing to OCP. Patient reports weight gain and increased hair loss while on depo-provera.  History   Social History  . Marital Status: Single    Spouse Name: N/A    Number of Children: N/A  . Years of Education: N/A   Occupational History  . Not on file.   Social History Main Topics  . Smoking status: Never Smoker   . Smokeless tobacco: Never Used  . Alcohol Use: Yes     rarely  . Drug Use: No  . Sexually Active: Yes -- Female partner(s)    Birth Control/ Protection: Injection   Other Topics Concern  . Not on file   Social History Narrative  . No narrative on file   Health Maintenance  Topic Date Due  . Tetanus/tdap  01/25/2006  . Influenza Vaccine  06/18/2012  . Pap Smear  04/09/2014       Review of Systems A comprehensive review of systems was negative.   Objective:     GENERAL: Well-developed, well-nourished female in no acute distress.  HEENT: Normocephalic, atraumatic. Sclerae anicteric.  NECK: Supple. Normal thyroid.  LUNGS: Clear to auscultation bilaterally.  HEART: Regular rate and rhythm. BREASTS: Symmetric in size. No palpable masses or lymphadenopathy, skin changes, or nipple drainage. ABDOMEN: Soft, nontender, nondistended. No organomegaly. PELVIC: Normal external female genitalia. Vagina is pink and rugated.  Normal discharge. Normal appearing cervix. Uterus is normal in size. No adnexal mass or tenderness. EXTREMITIES: No cyanosis, clubbing, or edema, 2+ distal pulses.    Assessment:    Healthy female exam.      Plan:     Pap smear performed Patient requested STD testing- cultures and blood work ordered [employer] control options discussed and patient wishes to continue with depo-provera for now. Birth  control information pamphlet See After Visit Summary for Counseling Recommendations

## 2012-04-16 LAB — RPR

## 2012-04-16 MED ORDER — AZITHROMYCIN 500 MG PO TABS: 1000.0000 mg | ORAL_TABLET | Freq: Once | ORAL | Status: AC

## 2012-04-16 NOTE — Progress Notes (Signed)
Patient ID: Monique Stone, female   DOB: 1986/05/06, 26 y.o.   MRN: [id number] STD panel negative except for positive chlamydia. Azithromycin 1 gram e-prescribed. It was advised that the partner be informed and treated

## 2012-04-16 NOTE — Addendum Note (Signed)
Addended by: Catalina Antigua on: 04/16/2012 02:53 PM   Modules accepted: Orders

## 2012-04-17 ENCOUNTER — Telehealth: Payer: Self-pay | Admitting: *Deleted

## 2012-04-17 NOTE — Telephone Encounter (Signed)
Left message for patient to call me back regarding results.

## 2012-04-17 NOTE — Telephone Encounter (Signed)
Message copied by Barbara Cower on Wed Apr 17, 2012  4:59 PM ------      Message from: CONSTANT, Gigi Gin      Created: Tue Apr 16, 2012  2:50 PM       Please inform patient of positive chlamydia result and need for treatment. A prescription has been e-prescribed. You can also inform her that the rest of the STD panel was negative.       Her partner will also need to be informed and treated for chlamydia.            Peggy

## 2012-04-18 ENCOUNTER — Telehealth: Payer: Self-pay | Admitting: *Deleted

## 2012-04-18 NOTE — Telephone Encounter (Signed)
Notified patient and she will go get script today.

## 2012-04-18 NOTE — Telephone Encounter (Signed)
Message copied by Barbara Cower on Thu Apr 18, 2012 11:04 AM ------      Message from: Catalina Antigua      Created: Tue Apr 16, 2012  2:50 PM       Please inform patient of positive chlamydia result and need for treatment. A prescription has been e-prescribed. You can also inform her that the rest of the STD panel was negative.       Her partner will also need to be informed and treated for chlamydia.            Peggy

## 2013-06-23 ENCOUNTER — Ambulatory Visit: Payer: Medicaid Other | Admitting: Obstetrics & Gynecology

## 2013-06-30 ENCOUNTER — Ambulatory Visit (INDEPENDENT_AMBULATORY_CARE_PROVIDER_SITE_OTHER): Payer: Medicaid Other | Admitting: Obstetrics & Gynecology

## 2013-06-30 ENCOUNTER — Other Ambulatory Visit (HOSPITAL_COMMUNITY)
Admission: RE | Admit: 2013-06-30 | Discharge: 2013-06-30 | Disposition: A | Discharge status: Home / Self Care | Payer: Medicaid Other | Source: Ambulatory Visit | Attending: Obstetrics & Gynecology | Admitting: Obstetrics & Gynecology

## 2013-06-30 ENCOUNTER — Encounter: Payer: Self-pay | Admitting: Obstetrics & Gynecology

## 2013-06-30 VITALS — BP 104/74 | HR 67 | Ht 59.0 in | Wt 126.0 lb

## 2013-06-30 DIAGNOSIS — R011 Cardiac murmur, unspecified: Secondary | ICD-10-CM

## 2013-06-30 DIAGNOSIS — Z113 Encounter for screening for infections with a predominantly sexual mode of transmission: Secondary | ICD-10-CM | POA: Insufficient documentation

## 2013-06-30 DIAGNOSIS — Z01419 Encounter for gynecological examination (general) (routine) without abnormal findings: Secondary | ICD-10-CM | POA: Insufficient documentation

## 2013-06-30 DIAGNOSIS — Z3041 Encounter for surveillance of contraceptive pills: Secondary | ICD-10-CM

## 2013-06-30 DIAGNOSIS — Z309 Encounter for contraceptive management, unspecified: Secondary | ICD-10-CM

## 2013-06-30 DIAGNOSIS — Z0000 Encounter for general adult medical examination without abnormal findings: Secondary | ICD-10-CM

## 2013-06-30 MED ORDER — NORGESTIMATE-ETH ESTRADIOL 0.25-35 MG-MCG PO TABS: 1.0000 | ORAL_TABLET | Freq: Every day | ORAL | Status: DC

## 2013-06-30 NOTE — Progress Notes (Signed)
Patient ID: Monique Stone, female   DOB: 08-18-1986, 27 y.o.   MRN: [id number] Subjective:    Monique Stone is a 27 y.o. female who presents for an annual exam. The patient has no complaints today.She needs a refill on her OCPs.  The patient is sexually active. GYN screening history: last pap: was normal. The patient wears seatbelts: yes. The patient participates in regular exercise: "sometimes". Has the patient ever been transfused or tattooed?: yes. The patient reports that there is not domestic violence in her life.   Menstrual History: OB History   Grav Para Term Preterm Abortions TAB SAB Ect Mult Living   2 2        2       Menarche age: 67 [place]: 86   Patient's last menstrual period was 06/13/2013.    The following portions of the patient's history were reviewed and updated as appropriate: allergies, current medications, past family history, past medical history, past social history, past surgical history and problem list.  Review of Systems A comprehensive review of systems was negative.  She declines Gardasil here but plans to get it [facility] the health dept. Her kids are 4 &6 yo. She lives with her kids and works at [employer]. She has been monogamous for 10 years but he gave her chlamydia about 2 years ago.    Objective:    BP 104/74  Pulse 67  Ht 4\' 11"  (1.499 m)  Wt 126 lb (57.153 kg)  BMI 25.44 kg/m2  LMP 06/13/2013  General Appearance:    Alert, cooperative, no distress, appears stated age  Head:    Normocephalic, without obvious abnormality, atraumatic  Eyes:    PERRL, conjunctiva/corneas clear, EOM's intact, fundi    benign, both eyes  Ears:    Normal TM's and external ear canals, both ears  Nose:   Nares normal, septum midline, mucosa normal, no drainage    or sinus tenderness  Throat:   Lips, mucosa, and tongue normal; teeth and gums normal  Neck:   Supple, symmetrical, trachea midline, no adenopathy;    thyroid:  no enlargement/tenderness/nodules; no carotid  bruit or JVD  Back:     Symmetric, no curvature, ROM normal, no CVA tenderness  Lungs:     Clear to auscultation bilaterally, respirations unlabored  Chest Wall:    No tenderness or deformity   Heart:    Regular rate and rhythm, S1 and S2 normal, 3/6 SEM (new onset)  Breast Exam:    No tenderness, masses, or nipple abnormality  Abdomen:     Soft, non-tender, bowel sounds active all four quadrants,    no masses, no organomegaly  Genitalia:    Normal female without lesion, discharge or tenderness, NSSA, deviated to the left, mobile, NT, normal adnexal exam     Extremities:   Extremities normal, atraumatic, no cyanosis or edema  Pulses:   2+ and symmetric all extremities  Skin:   Skin color, texture, turgor normal, no rashes or lesions  Lymph nodes:   Cervical, supraclavicular, and axillary nodes normal  Neurologic:   CNII-XII intact, normal strength, sensation and reflexes    throughout  .    Assessment:    Healthy female exam.  New onset SEM   Plan:     Breast self exam technique reviewed and patient encouraged to perform self-exam monthly. Chlamydia specimen. GC specimen. Thin prep Pap smear.  Refer to [facility] for eval of murmur

## 2013-06-30 NOTE — Patient Instructions (Signed)
Heart Murmur  A heart murmur is an extra sound heard by your caregiver when listening to your heart with a device called a stethoscope. The sound might be a "hum" or "whoosh" sound heard when the heart beats. The sound comes from turbulence when blood flows through the heart. There are two types of heart murmurs:   Innocent (Harmless) murmurs: Most people with this type of heart murmur do not have signs or symptoms of heart problems. Many children have innocent heart murmurs. When an innocent heart murmur is found, there is no need to get tests or do treatment. Also, there is no need to restrict activities or stop playing sports. Innocent heart murmurs may be caused by many things. For example, it might be caused by a tiny hole or defect in the wall of the heart. These defects often close as a child grows. An innocent heart murmur may be heard by an examining clinician throughout your life. If you see a new caregiver, please let him or her know this was found during past exams.   Abnormal murmurs: May have signs and symptoms of heart problems. These types of murmurs can occur in children and adults. In children, abnormal heart murmurs are typically caused from heart defects that are present at birth. In adults, abnormal murmurs are usually from heart valve problems caused by disease, infection, or aging.  SYMPTOMS    Innocent (Harmless) murmurs do not cause symptoms or require you to limit physical activity.   Many people with abnormal murmurs may or may not have symptoms. If symptoms do develop, they might include:   Shortness of breath.   Blue coloring of the skin, especially on the fingertips.   Chest pain.   Palpitations or feeling a "fluttering" or a "skipped" heart beat.   Fainting.   Persistent cough.   Getting tired much faster than expected.  DIAGNOSIS   A heart murmur might be heard during a pre-sports physical or during any type of examination. When a murmur is heard, it may suggest a possible  problem. When this happens, your caregiver may ask you to see a heart specialist (cardiologist). You may also be asked to undergo one or more heart tests. In these cases, testing may vary depending upon what your caregiver heard. Tests for a heart murmur might include one or more of the following:   EKG (electrocardiogram).   Echocardiogram.   Cardiac MRI.  For children and adults who have an abnormal heart murmur and want to play sports, it is important to complete testing, review test results, and receive recommendations from your caregiver. If heart disease is present, it may be risky to play.  Finding out the results of your test  Not all test results are available during your visit. If your test results are not back during the visit, make an appointment with your caregiver to find out the results. Do not assume everything is normal if you have not heard from your caregiver or the medical facility. It is important for you to follow up on all of your test results.   TREATMENT   As noted above, innocent (harmless) murmurs require no treatment or activity restriction. If the murmur represents a problem with the heart, treatment will depend upon the exact nature of the problem. In these cases, medicine or surgery may be needed to treat the problem.  HOME CARE INSTRUCTIONS  If you want to participate in sports or other types of strenuous physical activity, it is important to   discuss this first with your caregiver. If the murmur represents a problem with the heart and you choose to participate in sports, there is a small chance that a serious problem (including sudden death) could result.   SEEK MEDICAL CARE IF:    You feel that your symptoms are slowly worsening.   You develop any new symptoms that cause concern.   You feel that you are having side effects from any medicines prescribed.  SEEK IMMEDIATE MEDICAL CARE IF:    Chest pain develops.   You are short of breath.   You notice that your heart beats  irregularly often enough to cause you to worry.   You have fainting spells.   There is a worsening of any problems that brought you or your child in for medical care.  Document Released: 10/12/2004 Document Revised: 11/27/2011 Document Reviewed: 11/12/2007  Exit[employer] Patient Information 2014 [employer].

## 2014-04-18 HISTORY — PX: INDUCED ABORTION: SHX677

## 2014-07-20 ENCOUNTER — Encounter: Payer: Self-pay | Admitting: Obstetrics & Gynecology

## 2014-08-19 ENCOUNTER — Ambulatory Visit: Payer: Medicaid Other | Admitting: Obstetrics & Gynecology

## 2014-08-26 ENCOUNTER — Encounter: Payer: Self-pay | Admitting: Obstetrics & Gynecology

## 2014-08-26 ENCOUNTER — Ambulatory Visit (INDEPENDENT_AMBULATORY_CARE_PROVIDER_SITE_OTHER): Payer: Self-pay | Admitting: Obstetrics & Gynecology

## 2014-08-26 VITALS — BP 121/71 | HR 77 | Ht 59.0 in | Wt 129.2 lb

## 2014-08-26 DIAGNOSIS — Z01812 Encounter for preprocedural laboratory examination: Secondary | ICD-10-CM

## 2014-08-26 DIAGNOSIS — Z30430 Encounter for insertion of intrauterine contraceptive device: Secondary | ICD-10-CM

## 2014-08-26 DIAGNOSIS — Z975 Presence of (intrauterine) contraceptive device: Secondary | ICD-10-CM

## 2014-08-26 DIAGNOSIS — Z01419 Encounter for gynecological examination (general) (routine) without abnormal findings: Secondary | ICD-10-CM

## 2014-08-26 LAB — POCT URINE PREGNANCY: PREG TEST UR: NEGATIVE

## 2014-08-26 NOTE — Progress Notes (Signed)
Patient is here for yearly exam and would like to have Mirena IUD inserted.  Patient did have an abortion in August and would like to make sure everything is ok from having this.

## 2014-08-26 NOTE — Patient Instructions (Signed)
Intrauterine Device Insertion, Care After Refer to this sheet in the next few weeks. These instructions provide you with information on caring for yourself after your procedure. Your health care provider may also give you more specific instructions. Your treatment has been planned according to current medical practices, but problems sometimes occur. Call your health care provider if you have any problems or questions after your procedure. WHAT TO EXPECT AFTER THE PROCEDURE Insertion of the IUD may cause some discomfort, such as cramping. The cramping should improve after the IUD is in place. You may have bleeding after the procedure. This is normal. It varies from light spotting for a few days to menstrual-like bleeding. When the IUD is in place, a string will extend past the cervix into the vagina for 1-2 inches. The strings should not bother you or your partner. If they do, talk to your health care provider.  HOME CARE INSTRUCTIONS   Check your intrauterine device (IUD) to make sure it is in place before you resume sexual activity. You should be able to feel the strings. If you cannot feel the strings, something may be wrong. The IUD may have fallen out of the uterus, or the uterus may have been punctured (perforated) during placement. Also, if the strings are getting longer, it may mean that the IUD is being forced out of the uterus. You no longer have full protection from pregnancy if any of these problems occur.  You may resume sexual intercourse if you are not having problems with the IUD. The copper IUD is considered immediately effective, and the hormone IUD works right away if inserted within 7 days of your period starting. You will need to use a backup method of birth control for 7 days if the IUD in inserted at any other time in your cycle.  Continue to check that the IUD is still in place by feeling for the strings after every menstrual period.  You may need to take pain medicine such as  acetaminophen or ibuprofen. Only take medicines as directed by your health care provider. SEEK MEDICAL CARE IF:   You have bleeding that is heavier or lasts longer than a normal menstrual cycle.  You have a fever.  You have increasing cramps or abdominal pain not relieved with medicine.  You have abdominal pain that does not seem to be related to the same area of earlier cramping and pain.  You are lightheaded, unusually weak, or faint.  You have abnormal vaginal discharge or smells.  You have pain during sexual intercourse.  You cannot feel the IUD strings, or the IUD string has gotten longer.  You feel the IUD at the opening of the cervix in the vagina.  You think you are pregnant, or you miss your menstrual period.  The IUD string is hurting your sex partner. MAKE SURE YOU:  Understand these instructions.  Will watch your condition.  Will get help right away if you are not doing well or get worse. Document Released: 05/03/2011 Document Revised: 06/25/2013 Document Reviewed: 02/23/2013 [facility] Patient Information 2015 [place], [place]. This information is not intended to replace advice given to you by your health care provider. Make sure you discuss any questions you have with your health care provider. Levonorgestrel intrauterine device (IUD) What is this medicine? LEVONORGESTREL IUD (LEE voe nor jes trel) is a contraceptive (birth control) device. The device is placed inside the uterus by a healthcare professional. It is used to prevent pregnancy and can also be used to  treat heavy bleeding that occurs during your period. Depending on the device, it can be used for 3 to 5 years. This medicine may be used for other purposes; ask your health care provider or pharmacist if you have questions. COMMON BRAND NAME(S): Verda Cumins What should I tell my health care provider before I take this medicine? They need to know if you have any of these conditions: -abnormal Pap  smear -cancer of the breast, uterus, or cervix -diabetes -endometritis -genital or pelvic infection now or in the past -have more than one sexual partner or your partner has more than one partner -heart disease -history of an ectopic or tubal pregnancy -immune system problems -IUD in place -liver disease or tumor -problems with blood clots or take blood-thinners -use intravenous drugs -uterus of unusual shape -vaginal bleeding that has not been explained -an unusual or allergic reaction to levonorgestrel, other hormones, silicone, or polyethylene, medicines, foods, dyes, or preservatives -pregnant or trying to get pregnant -breast-feeding How should I use this medicine? This device is placed inside the uterus by a health care professional. Talk to your pediatrician regarding the use of this medicine in children. Special care may be needed. Overdosage: If you think you have taken too much of this medicine contact a poison control center or emergency room at once. NOTE: This medicine is only for you. Do not share this medicine with others. What if I miss a dose? This does not apply. What may interact with this medicine? Do not take this medicine with any of the following medications: -amprenavir -bosentan -fosamprenavir This medicine may also interact with the following medications: -aprepitant -barbiturate medicines for inducing sleep or treating seizures -bexarotene -griseofulvin -medicines to treat seizures like carbamazepine, ethotoin, felbamate, oxcarbazepine, phenytoin, topiramate -modafinil -pioglitazone -rifabutin -rifampin -rifapentine -some medicines to treat HIV infection like atazanavir, indinavir, lopinavir, nelfinavir, tipranavir, ritonavir -St. John's wort -warfarin This list may not describe all possible interactions. Give your health care provider a list of all the medicines, herbs, non-prescription drugs, or dietary supplements you use. Also tell them if  you smoke, drink alcohol, or use illegal drugs. Some items may interact with your medicine. What should I watch for while using this medicine? Visit your doctor or health care professional for regular check ups. See your doctor if you or your partner has sexual contact with others, becomes HIV positive, or gets a sexual transmitted disease. This product does not protect you against HIV infection (AIDS) or other sexually transmitted diseases. You can check the placement of the IUD yourself by reaching up to the top of your vagina with clean fingers to feel the threads. Do not pull on the threads. It is a good habit to check placement after each menstrual period. Call your doctor right away if you feel more of the IUD than just the threads or if you cannot feel the threads at all. The IUD may come out by itself. You may become pregnant if the device comes out. If you notice that the IUD has come out use a backup birth control method like condoms and call your health care provider. Using tampons will not change the position of the IUD and are okay to use during your period. What side effects may I notice from receiving this medicine? Side effects that you should report to your doctor or health care professional as soon as possible: -allergic reactions like skin rash, itching or hives, swelling of the face, lips, or tongue -fever, flu-like symptoms -genital sores -  high blood pressure -no menstrual period for 6 weeks during use -pain, swelling, warmth in the leg -pelvic pain or tenderness -severe or sudden headache -signs of pregnancy -stomach cramping -sudden shortness of breath -trouble with balance, talking, or walking -unusual vaginal bleeding, discharge -yellowing of the eyes or skin Side effects that usually do not require medical attention (report to your doctor or health care professional if they continue or are bothersome): -acne -breast pain -change in sex drive or  performance -changes in weight -cramping, dizziness, or faintness while the device is being inserted -headache -irregular menstrual bleeding within first 3 to 6 months of use -nausea This list may not describe all possible side effects. Call your doctor for medical advice about side effects. You may report side effects to FDA at 1-800-FDA-1088. Where should I keep my medicine? This does not apply. NOTE: This sheet is a summary. It may not cover all possible information. If you have questions about this medicine, talk to your doctor, pharmacist, or health care provider.  2015, [employer]vier/[employer] Standard. (2011-10-05 13:54:04)

## 2014-08-26 NOTE — Progress Notes (Signed)
Patient ID: Monique Stone, female   DOB: 1986-05-11, 28 y.o.   MRN: [id number] Subjective:     Monique Stone is a 28 y.o. female here for a routine exam.  Current complaints: none.  Pt on OCP's until early 2015.  Using condoms since that time.  LMP 08/13/2014.  Pt reports being sexully active since that time but,. ALWAYS with condoms   Gynecologic History Patient's last menstrual period was 08/13/2014 (exact date). Contraception: OCP (estrogen/progesterone) Last Pap: 06/2013. Results were: normal Last mammogram: n/a. Results were: normal  Obstetric History OB History  Gravida Para Term Preterm AB SAB TAB Ectopic Multiple Living  3 2   1  1   2     # Outcome Date GA Lbr Len/2nd Weight Sex Delivery Anes PTL Lv  3 TAB 04/26/14          2 Para 2010    M CS-Classical   Y  1 Para 2008    F CS-Classical   Y       The following portions of the patient's history were reviewed and updated as appropriate: allergies, current medications, past family history, past medical history, past social history, past surgical history and problem list.  Review of Systems A comprehensive review of systems was negative.    Objective:    BP 121/71 mmHg  Pulse 77  Ht 4\' 11"  (1.499 m)  Wt 129 lb 3.2 oz (58.605 kg)  BMI 26.08 kg/m2  LMP 08/13/2014 (Exact Date)  General Appearance:    Alert, cooperative, no distress, appears stated age  Head:    Normocephalic, without obvious abnormality, atraumatic           Throat:   Lips, mucosa, and tongue normal; teeth and gums normal  Neck:   Supple, symmetrical, trachea midline, no adenopathy;    thyroid:  no enlargement/tenderness/nodules; no carotid   bruit or JVD  Back:     Symmetric, no curvature, ROM normal, no CVA tenderness  Lungs:     Clear to auscultation bilaterally, respirations unlabored  Chest Wall:    No tenderness or deformity   Heart:    Regular rate and rhythm, S1 and S2 normal, no murmur, rub   or gallop  Breast Exam:    No tenderness,  masses, or nipple abnormality  Abdomen:     Soft, non-tender, bowel sounds active all four quadrants,    no masses, no organomegaly  Genitalia:    Normal female without lesion, discharge or tenderness See IUD insertion below     Extremities:   Extremities normal, atraumatic, no cyanosis or edema  Pulses:   2+ and symmetric all extremities  Skin:   Skin color, texture, turgor normal, no rashes or lesions           IUD Insertion Procedure Note Patient identified, informed consent performed.  Discussed risks of irregular bleeding, cramping, infection, malpositioning or misplacement of the IUD outside the uterus which may require further procedures. Time out was performed.  Urine pregnancy test negative.  Speculum placed in the vagina.  Cervix visualized.  Cleaned with Betadine x 2.  Grasped anteriorly with a single tooth tenaculum.  Uterus sounded to 8 cm.  Mirena IUD placed per manufacturer's recommendations.  Strings trimmed to 3 cm. Tenaculum was removed, good hemostasis noted.  Patient tolerated procedure well.     Assessment:    Healthy female exam.   Contraception management IUD placement    Plan:    Follow up in: 4 weeks.  for  IUD check Patient was given post-procedure instructions.  Patient was also asked to check IUD strings periodically and follow up in 4 weeks for IUD check. F/u 1 year for annual

## 2014-08-27 LAB — GC/CHLAMYDIA PROBE AMP
CT PROBE, AMP APTIMA: NEGATIVE
GC PROBE AMP APTIMA: NEGATIVE

## 2015-08-17 ENCOUNTER — Emergency Department
Admission: EM | Admit: 2015-08-17 | Discharge: 2015-08-17 | Disposition: A | Discharge status: Home / Self Care | Payer: Self-pay | Attending: Emergency Medicine | Admitting: Emergency Medicine

## 2015-08-17 ENCOUNTER — Emergency Department: Payer: Medicaid Other

## 2015-08-17 ENCOUNTER — Encounter: Payer: Self-pay | Admitting: Emergency Medicine

## 2015-08-17 DIAGNOSIS — R0789 Other chest pain: Secondary | ICD-10-CM | POA: Insufficient documentation

## 2015-08-17 DIAGNOSIS — Z3202 Encounter for pregnancy test, result negative: Secondary | ICD-10-CM | POA: Insufficient documentation

## 2015-08-17 LAB — CBC
HCT: 38.7 % (ref 35.0–47.0)
Hemoglobin: 13 g/dL (ref 12.0–16.0)
MCH: 31.7 pg (ref 26.0–34.0)
MCHC: 33.6 g/dL (ref 32.0–36.0)
MCV: 94.3 fL (ref 80.0–100.0)
PLATELETS: 302 10*3/uL (ref 150–440)
RBC: 4.11 MIL/uL (ref 3.80–5.20)
RDW: 12.9 % (ref 11.5–14.5)
WBC: 7 10*3/uL (ref 3.6–11.0)

## 2015-08-17 LAB — TROPONIN I: Troponin I: <0.03 ng/mL (ref <0.031)

## 2015-08-17 LAB — BASIC METABOLIC PANEL
Anion gap: 6 (ref 5–15)
BUN: 12 mg/dL (ref 6–20)
CALCIUM: 8.6 mg/dL — AB (ref 8.9–10.3)
CO2: 25 mmol/L (ref 22–32)
CREATININE: 0.66 mg/dL (ref 0.44–1.00)
Chloride: 104 mmol/L (ref 101–111)
GLUCOSE: 106 mg/dL — AB (ref 65–99)
Potassium: 3.3 mmol/L — ABNORMAL LOW (ref 3.5–5.1)
Sodium: 135 mmol/L (ref 135–145)

## 2015-08-17 LAB — POCT PREGNANCY, URINE: Preg Test, Ur: NEGATIVE

## 2015-08-17 LAB — FIBRIN DERIVATIVES D-DIMER (ARMC ONLY): Fibrin derivatives D-dimer (ARMC): 342 (ref 0–499)

## 2015-08-17 MED ORDER — NAPROXEN SODIUM 275 MG PO TABS: 275.0000 mg | ORAL_TABLET | Freq: Two times a day (BID) | ORAL | Status: AC

## 2015-08-17 NOTE — Discharge Instructions (Signed)

## 2015-08-17 NOTE — ED Notes (Addendum)
States she developed cold sx's about 3 weeks ago with some chest discomfort  Pain increases with breathing states pain is right post shoulder and into chest

## 2015-08-17 NOTE — ED Provider Notes (Signed)
[facility] Emergency Department Provider Note  ____________________________________________   I have reviewed the triage vital signs and the nursing notes.   HISTORY  Chief Complaint Chest Pain    HPI Monique Stone is a 29 y.o. female who is healthy states that she had a coughing fit 2 weeks ago and hurt her chest wall and ever since that time and has been "sore. She denies any significant shortness of breath. It hurts when she touches it or when she changes position or when she takes a deep breath. Triage note states that she is short of breath sometimes but to me she denies it. She denies any leg swelling or recent travel or surgery. She denies a personal or family history of PE or DVT. She denies any oral birth control pills but she does have the Mirena. She thinks it is rib pain but has been lasting longer than she anticipates.  Past Medical History  Diagnosis Date  . Anemia   . Abnormal Pap smear   . GBS (group B streptococcus) infection     PREVIOUS PREGNANCY  . Chlamydia trachomatis infection in pregnancy 08/25/08  . Abnormal quad screen     There are no active problems to display for this patient.   Past Surgical History  Procedure Laterality Date  . Cesarean section  2008  . Cesarean section  2010  . Induced abortion  04/2014    No current outpatient prescriptions on file.  Allergies Review of patient's allergies indicates no known allergies.  Family History  Problem Relation Age of Onset  . Diabetes Maternal Grandmother   . Hypothyroidism Mother     Social History Social History  Substance Use Topics  . Smoking status: Never Smoker   . Smokeless tobacco: Never Used  . Alcohol Use: 0.0 oz/week    0 Standard drinks or equivalent per week     Comment: rarely    Review of Systems Constitutional: No fever/chills Eyes: No visual changes. ENT: No sore throat. No stiff neck no neck pain Cardiovascular:See history of present  illness regardingt pain. Respiratory: Denies shortness of breath. Gastrointestinal:   no vomiting.  No diarrhea.  No constipation. Genitourinary: Negative for dysuria. Musculoskeletal: Negative lower extremity swelling Skin: Negative for rash. Neurological: Negative for headaches, focal weakness or numbness. 10-point ROS otherwise negative.  ____________________________________________   PHYSICAL EXAM:  VITAL SIGNS: ED Triage Vitals  Enc Vitals Group     BP 08/17/15 1747 115/80 mmHg     Pulse Rate 08/17/15 1747 80     Resp 08/17/15 1747 20     Temp 08/17/15 1747 98.4 F (36.9 C)     Temp Source 08/17/15 1747 Oral     SpO2 08/17/15 1747 100 %     Weight 08/17/15 1747 135 lb (61.236 kg)     Height 08/17/15 1747 4\' 11"  (1.499 m)     Head Cir --      Peak Flow --      Pain Score 08/17/15 1745 9     Pain Loc --      Pain Edu? --      Excl. in [place]? --     Constitutional: Alert and oriented. Well appearing and in no acute distress. Eyes: Conjunctivae are normal. PERRL. EOMI. Head: Atraumatic. Nose: No congestion/rhinnorhea. Mouth/Throat: Mucous membranes are moist.  Oropharynx non-erythematous. Neck: No stridor.   Nontender with no meningismus Cardiovascular: Normal rate, regular rhythm. Grossly normal heart sounds.  Good peripheral circulation. Chest: Female nurse  tech present as chaperone, there is tenderness to palpation in the pectoralis muscle of the left chest wall above the breast. There is tenderness along the ribs there as well. There is no obvious rib fracture noted there is no flail chest there is no herpetic lesions or skin rash or bruising or evidence of cellulitis or abscess. The breast itself is nontender to palpation with no evidence of pathology noted. Respiratory: Normal respiratory effort.  No retractions. Lungs CTAB. Abdominal: Soft and nontender. No distention. No guarding no rebound Back:  There is no focal tenderness or step off there is no midline  tenderness there are no lesions noted. there is no CVA tenderness Musculoskeletal: No lower extremity tenderness. No joint effusions, no DVT signs strong distal pulses no edema Neurologic:  Normal speech and language. No gross focal neurologic deficits are appreciated.  Skin:  Skin is warm, dry and intact. No rash noted. Psychiatric: Mood and affect are normal. Speech and behavior are normal.  ____________________________________________   LABS (all labs ordered are listed, but only abnormal results are displayed)  Labs Reviewed  BASIC METABOLIC PANEL - Abnormal; Notable for the following:    Potassium 3.3 (*)    Glucose, Bld 106 (*)    Calcium 8.6 (*)    All other components within normal limits  TROPONIN I  CBC  FIBRIN DERIVATIVES D-DIMER ([facility] ONLY)   ____________________________________________  EKG  I personally interpreted any EKGs ordered by me or triage Normal sinus rhythm rate 87 bpm no acute ST elevation or acute ST depression normal axis unremarkable EKG  ____________________________________________  RADIOLOGY  I reviewed any imaging ordered by me or triage that were performed during my shift ____________________________________________   PROCEDURES  Procedure(s) performed: None  Critical Care performed: None  ____________________________________________   INITIAL IMPRESSION / ASSESSMENT AND PLAN / ED COURSE  Pertinent labs & imaging results that were available during my care of the patient were reviewed by me and considered in my medical decision making (see chart for details).  Patient is quite well-appearing has very reproduced with chest wall pain after a coughing spell, however given that she is taking the Mirena birth control pill although this is a limited risk we will obtain a d-dimer, if negative, I think the most obvious a nurses is the correct when I do not think that a CT will be warranted, if her d-dimer is positive we'll obtain a CT.   ____________________________________________   FINAL CLINICAL IMPRESSION(S) / ED DIAGNOSES  Final diagnoses:  None     Schuyler Amor, MD 08/17/15 [phone]

## 2015-08-30 ENCOUNTER — Ambulatory Visit: Payer: Medicaid Other | Admitting: Family Medicine

## 2015-09-09 ENCOUNTER — Encounter: Payer: Self-pay | Admitting: Obstetrics and Gynecology

## 2015-09-09 ENCOUNTER — Other Ambulatory Visit (HOSPITAL_COMMUNITY)
Admission: RE | Admit: 2015-09-09 | Discharge: 2015-09-09 | Disposition: A | Discharge status: Home / Self Care | Payer: Medicaid Other | Source: Ambulatory Visit | Attending: Obstetrics and Gynecology | Admitting: Obstetrics and Gynecology

## 2015-09-09 ENCOUNTER — Ambulatory Visit (INDEPENDENT_AMBULATORY_CARE_PROVIDER_SITE_OTHER): Payer: Medicaid Other | Admitting: Obstetrics and Gynecology

## 2015-09-09 VITALS — BP 108/74 | HR 81 | Resp 18 | Ht 59.0 in | Wt 130.0 lb

## 2015-09-09 DIAGNOSIS — Z01419 Encounter for gynecological examination (general) (routine) without abnormal findings: Secondary | ICD-10-CM | POA: Diagnosis not present

## 2015-09-09 DIAGNOSIS — Z124 Encounter for screening for malignant neoplasm of cervix: Secondary | ICD-10-CM | POA: Diagnosis not present

## 2015-09-09 DIAGNOSIS — Z113 Encounter for screening for infections with a predominantly sexual mode of transmission: Secondary | ICD-10-CM | POA: Insufficient documentation

## 2015-09-09 DIAGNOSIS — Z30431 Encounter for routine checking of intrauterine contraceptive device: Secondary | ICD-10-CM | POA: Diagnosis not present

## 2015-09-09 NOTE — Progress Notes (Signed)
  Subjective:     Monique Stone is a 29 y.o. female [phone] with BMI 26 who is here for a comprehensive physical exam. The patient reports no problems. Patient is sexually active using condoms for contraception. She does report the presence of a vaginal odor for the past several months. She denies any vaginal discharge or pruritis.  Past Medical History  Diagnosis Date  . Anemia   . Abnormal Pap smear   . GBS (group B streptococcus) infection     PREVIOUS PREGNANCY  . Chlamydia trachomatis infection in pregnancy 08/25/08  . Abnormal quad screen    Past Surgical History  Procedure Laterality Date  . Cesarean section  2008  . Cesarean section  2010  . Induced abortion  04/2014   Family History  Problem Relation Age of Onset  . Diabetes Maternal Grandmother   . Hypothyroidism Mother    Social History  Substance Use Topics  . Smoking status: Never Smoker   . Smokeless tobacco: Never Used  . Alcohol Use: 0.0 oz/week    0 Standard drinks or equivalent per week     Comment: rarely    Social History   Social History  . Marital Status: Single    Spouse Name: N/A  . Number of Children: N/A  . Years of Education: N/A   Occupational History  . Not on file.   Social History Main Topics  . Smoking status: Never Smoker   . Smokeless tobacco: Never Used  . Alcohol Use: 0.0 oz/week    0 Standard drinks or equivalent per week     Comment: rarely  . Drug Use: No  . Sexual Activity:    Partners: Male    Patent examiner Protection: None, IUD   Other Topics Concern  . Not on file   Social History Narrative   Health Maintenance  Topic Date Due  . Samul Dada  01/25/2006  . INFLUENZA VACCINE  04/19/2015  . PAP SMEAR  06/30/2016  . HIV Screening  Completed       Review of Systems Pertinent items are noted in HPI.   Objective:  Blood pressure 108/74, pulse 81, resp. rate 18, height 4\' 11"  (1.499 m), weight 130 lb (58.968 kg).     GENERAL: Well-developed,  well-nourished female in no acute distress.  HEENT: Normocephalic, atraumatic. Sclerae anicteric.  NECK: Supple. Normal thyroid.  LUNGS: Clear to auscultation bilaterally.  HEART: Regular rate and rhythm. BREASTS: Symmetric in size. No palpable masses or lymphadenopathy, skin changes, or nipple drainage. ABDOMEN: Soft, nontender, nondistended. No organomegaly. PELVIC: Normal external female genitalia. Vagina is pink and rugated.  Normal discharge. Normal appearing cervix with IUD strings at the os. Uterus is normal in size. No adnexal mass or tenderness. EXTREMITIES: No cyanosis, clubbing, or edema, 2+ distal pulses.    Assessment:    Healthy female exam.      Plan:    pap smear with cultures performed today. Patient understands that a pap smear is recommended every 3 years now but still desires it to be done today Wet prep collected Patient will be contacted with any abnormal results RTC prn See After Visit Summary for Counseling Recommendations

## 2015-09-10 LAB — WET PREP, GENITAL
TRICH WET PREP: NONE SEEN
Yeast Wet Prep HPF POC: NONE SEEN

## 2015-09-10 LAB — CYTOLOGY - PAP

## 2016-09-13 ENCOUNTER — Encounter: Payer: Self-pay | Admitting: *Deleted

## 2016-09-13 ENCOUNTER — Encounter: Payer: Self-pay | Admitting: Obstetrics and Gynecology

## 2016-09-13 ENCOUNTER — Ambulatory Visit (INDEPENDENT_AMBULATORY_CARE_PROVIDER_SITE_OTHER): Payer: Medicaid Other | Admitting: Obstetrics and Gynecology

## 2016-09-13 ENCOUNTER — Other Ambulatory Visit (HOSPITAL_COMMUNITY)
Admission: RE | Admit: 2016-09-13 | Discharge: 2016-09-13 | Disposition: A | Discharge status: Home / Self Care | Payer: Medicaid Other | Source: Ambulatory Visit | Attending: Obstetrics and Gynecology | Admitting: Obstetrics and Gynecology

## 2016-09-13 VITALS — BP 120/77 | HR 90 | Resp 18 | Ht 59.0 in | Wt 130.0 lb

## 2016-09-13 DIAGNOSIS — B9689 Other specified bacterial agents as the cause of diseases classified elsewhere: Secondary | ICD-10-CM

## 2016-09-13 DIAGNOSIS — N760 Acute vaginitis: Secondary | ICD-10-CM

## 2016-09-13 DIAGNOSIS — B3731 Acute candidiasis of vulva and vagina: Secondary | ICD-10-CM

## 2016-09-13 DIAGNOSIS — B373 Candidiasis of vulva and vagina: Secondary | ICD-10-CM

## 2016-09-13 DIAGNOSIS — Z01419 Encounter for gynecological examination (general) (routine) without abnormal findings: Secondary | ICD-10-CM | POA: Diagnosis not present

## 2016-09-13 DIAGNOSIS — Z309 Encounter for contraceptive management, unspecified: Secondary | ICD-10-CM | POA: Diagnosis not present

## 2016-09-13 DIAGNOSIS — Z0000 Encounter for general adult medical examination without abnormal findings: Secondary | ICD-10-CM | POA: Diagnosis not present

## 2016-09-13 MED ORDER — METRONIDAZOLE 500 MG PO TABS: 500.0000 mg | ORAL_TABLET | Freq: Two times a day (BID) | ORAL | 1 refills | Status: DC

## 2016-09-13 MED ORDER — FLUCONAZOLE 150 MG PO TABS: ORAL_TABLET | ORAL | 1 refills | Status: DC

## 2016-09-13 NOTE — Progress Notes (Signed)
Obstetrics and Gynecology Visit Established Patient Annual Exam  Appointment Date: 09/13/2016  OBGYN Clinic: [facility] for [facility]  Chief Complaint:  Chief Complaint  Patient presents with  . Gynecologic Exam    History of Present Illness: Monique Stone is a 30 y.o. African-American G3P2 (No LMP recorded. Patient is not currently having periods (Reason: IUD).), seen for the above chief complaint. Her past medical history is significant for nothing.  Only endorses vaginal discharge with slight smell for the past few months. No vaginitis.   No breast s/s, fevers, chills, chest pain, SOB, nausea, vomiting, abdominal pain, dysuria, hematuria, vaginal itching, dyspareunia, diarrhea, constipation, blood in BMs  Review of Systems:  as noted in the History of Present Illness.  There are no active problems to display for this patient.   Past Medical History:  Past Medical History:  Diagnosis Date  . Abnormal Pap smear   . Anemia   . Chlamydia trachomatis infection in pregnancy 08/25/08    Past Surgical History:  Past Surgical History:  Procedure Laterality Date  . CESAREAN SECTION  2008  . CESAREAN SECTION  2010  . INDUCED ABORTION  04/2014    Past Obstetrical History:  OB History  Gravida Para Term Preterm AB Living  3 2     1 2   SAB TAB Ectopic Multiple Live Births    1     2    # Outcome Date GA Lbr Len/2nd Weight Sex Delivery Anes PTL Lv  3 TAB 04/26/14          2 Para 2010    M CS-Classical   LIV  1 Para 2008    F CS-Classical   LIV      Past Gynecological History: As per HPI. No periods with Mirena.  Mirena place in 08/2014 Pap 08/2015 negative (no EC/TZ seen)  Social History:  Social History   Social History  . Marital status: Single    Spouse name: N/A  . Number of children: N/A  . Years of education: N/A   Occupational History  . Not on file.   Social History Main Topics  . Smoking status: Never Smoker  . Smokeless tobacco: Never Used  .  Alcohol use 0.0 oz/week     Comment: rarely  . Drug use: No  . Sexual activity: Yes    Partners: Male    Birth control/ protection: IUD   Other Topics Concern  . Not on file   Social History Narrative  . No narrative on file    Family History:  Family History  Problem Relation Age of Onset  . Diabetes Maternal Grandmother   . Hypothyroidism Mother    She denies any female cancers, bleeding or blood clotting disorders.   Medications Ms. Leachman had no medications administered during this visit. Current Outpatient Prescriptions  Medication Sig Dispense Refill  . levonorgestrel (MIRENA) 20 MCG/24HR IUD 1 each by Intrauterine route once.     No current facility-administered medications for this visit.     Allergies Patient has no known allergies.   Physical Exam:  BP 120/77 (BP Location: Left Arm, Patient Position: Sitting, Cuff Size: Normal)   Pulse 90   Resp 18   Ht 4\' 11"  (1.499 m)   Wt 130 lb (59 kg)   BMI 26.26 kg/m  Body mass index is 26.26 kg/m. General appearance: Well nourished, well developed female in no acute distress.  Neck:  Supple, normal appearance, and no thyromegaly  Cardiovascular: normal s1 and s2.  No murmurs, rubs or gallops. Respiratory:  Clear to auscultation bilateral. Normal respiratory effort Abdomen: positive bowel sounds and no masses, hernias; diffusely non tender to palpation, non distended Breasts: breasts appear normal, no suspicious masses, no skin or nipple changes or axillary nodes, and normal palpation. Neuro/Psych:  Normal mood and affect.  Skin:  Warm and dry.  Lymphatic:  No inguinal lymphadenopathy.   Pelvic exam: is not limited by body habitus EGBUS: within normal limits, Vagina: no blood, +white cottage cheese like d/c. Cervix: normal appearing cervix without tenderness, discharge or lesions, IUD strings tucked into the fornices. Uterus:  nonenlarged and non tender and Adnexa:  normal adnexa and no mass, fullness,  tenderness Rectovaginal: deferred  Laboratory: None  Radiology: none  Assessment: pt doing well. VVC and BV  Plan:  Normal well woman exam. Pap done today. Flagyl and diflucan for infections.   RTC PRN  Durene Romans MD Attending Center for [employer] [facility] [employer])

## 2016-09-15 LAB — CYTOLOGY - PAP: DIAGNOSIS: NEGATIVE

## 2017-03-14 ENCOUNTER — Other Ambulatory Visit (HOSPITAL_COMMUNITY)
Admission: RE | Admit: 2017-03-14 | Discharge: 2017-03-14 | Disposition: A | Discharge status: Home / Self Care | Payer: Medicaid Other | Source: Ambulatory Visit | Attending: Family Medicine | Admitting: Family Medicine

## 2017-03-14 ENCOUNTER — Other Ambulatory Visit: Payer: Medicaid Other | Admitting: *Deleted

## 2017-03-14 DIAGNOSIS — N898 Other specified noninflammatory disorders of vagina: Secondary | ICD-10-CM

## 2017-03-14 DIAGNOSIS — Z113 Encounter for screening for infections with a predominantly sexual mode of transmission: Secondary | ICD-10-CM

## 2017-03-14 NOTE — Progress Notes (Signed)
Pt is here today c/o a vaginal discharge that is yellowish in color and has a slight odor with no itching or irritation. Requesting screening for GC/CT to be added as well.  Self swab performed and sent to lab.  Will contact pt with results when available and treat as necessary.

## 2017-03-15 LAB — CERVICOVAGINAL ANCILLARY ONLY
Bacterial vaginitis: POSITIVE — AB
CANDIDA VAGINITIS: NEGATIVE
CHLAMYDIA, DNA PROBE: NEGATIVE
Neisseria Gonorrhea: NEGATIVE
TRICH (WINDOWPATH): POSITIVE — AB

## 2017-03-19 ENCOUNTER — Telehealth: Payer: Self-pay | Admitting: *Deleted

## 2017-03-19 DIAGNOSIS — N760 Acute vaginitis: Secondary | ICD-10-CM

## 2017-03-19 DIAGNOSIS — A599 Trichomoniasis, unspecified: Secondary | ICD-10-CM

## 2017-03-19 DIAGNOSIS — B9689 Other specified bacterial agents as the cause of diseases classified elsewhere: Secondary | ICD-10-CM

## 2017-03-19 MED ORDER — METRONIDAZOLE 500 MG PO TABS: 500.0000 mg | ORAL_TABLET | Freq: Two times a day (BID) | ORAL | 1 refills | Status: DC

## 2017-03-19 NOTE — Telephone Encounter (Signed)
Pt called in for test results - sent in Flagyl bid x7days. Pt expressed understanding.

## 2017-10-02 ENCOUNTER — Encounter: Payer: Self-pay | Admitting: Obstetrics and Gynecology

## 2017-10-02 ENCOUNTER — Ambulatory Visit (INDEPENDENT_AMBULATORY_CARE_PROVIDER_SITE_OTHER): Payer: Medicaid Other | Admitting: Obstetrics and Gynecology

## 2017-10-02 VITALS — BP 110/75 | HR 81 | Ht 59.0 in | Wt 128.0 lb

## 2017-10-02 DIAGNOSIS — B373 Candidiasis of vulva and vagina: Secondary | ICD-10-CM

## 2017-10-02 DIAGNOSIS — Z01419 Encounter for gynecological examination (general) (routine) without abnormal findings: Secondary | ICD-10-CM

## 2017-10-02 DIAGNOSIS — Z309 Encounter for contraceptive management, unspecified: Secondary | ICD-10-CM | POA: Diagnosis not present

## 2017-10-02 DIAGNOSIS — B3731 Acute candidiasis of vulva and vagina: Secondary | ICD-10-CM

## 2017-10-02 DIAGNOSIS — N760 Acute vaginitis: Secondary | ICD-10-CM

## 2017-10-02 DIAGNOSIS — B9689 Other specified bacterial agents as the cause of diseases classified elsewhere: Secondary | ICD-10-CM

## 2017-10-02 MED ORDER — FLUCONAZOLE 150 MG PO TABS: ORAL_TABLET | ORAL | 1 refills | Status: DC

## 2017-10-02 MED ORDER — METRONIDAZOLE 500 MG PO TABS: 500.0000 mg | ORAL_TABLET | Freq: Two times a day (BID) | ORAL | 1 refills | Status: AC

## 2017-10-03 NOTE — Progress Notes (Signed)
Obstetrics and Gynecology Annual Patient Evaluation  Appointment Date: 10/03/2017  OBGYN Clinic: [facility] for [facility]  Primary Care Provider: None  Chief Complaint:  Chief Complaint  Patient presents with  . Gynecologic Exam    History of Present Illness: Monique Stone is a 32 y.o. African-American G3P2 (No LMP recorded. Patient is not currently having periods (Reason: IUD).), seen for the above chief complaint. Her past medical history is significant for nothing  Has what she believes are BV s/s with malodorous d/c, no itching; last episode mid 2018. She states that she does summer's eve washes qhs.     No breast s/s, fevers, chills, chest pain, SOB, nausea, vomiting, abdominal pain, dysuria, hematuria, vaginal itching, dyspareunia, diarrhea, constipation, blood in BMs  Review of Systems:  as noted in the History of Present Illness.   Past Medical History:  Past Medical History:  Diagnosis Date  . Abnormal Pap smear   . Anemia   . Chlamydia trachomatis infection in pregnancy 08/25/08    Past Surgical History:  Past Surgical History:  Procedure Laterality Date  . CESAREAN SECTION  2008  . CESAREAN SECTION  2010  . INDUCED ABORTION  04/2014    Past Obstetrical History:  OB History  Gravida Para Term Preterm AB Living  3 2 0 0 1 2  SAB TAB Ectopic Multiple Live Births  0 1 0 0 2    # Outcome Date GA Lbr Len/2nd Weight Sex Delivery Anes PTL Lv  3 TAB 04/26/14          2 Para 2010    M CS-Classical   LIV  1 Para 2008    F CS-Classical   LIV      Past Gynecological History: As per HPI. As per HPI. No periods with Mirena.  Mirena place in 08/2014 Pap 08/2015 negative (no EC/TZ seen)  Social History:  Social History   Socioeconomic History  . Marital status: Single    Spouse name: Not on file  . Number of children: Not on file  . Years of education: Not on file  . Highest education level: Not on file  Social Needs  . Financial resource  strain: Not on file  . Food insecurity - worry: Not on file  . Food insecurity - inability: Not on file  . Transportation needs - medical: Not on file  . Transportation needs - non-medical: Not on file  Occupational History  . Not on file  Tobacco Use  . Smoking status: Never Smoker  . Smokeless tobacco: Never Used  Substance and Sexual Activity  . Alcohol use: Yes    Alcohol/week: 0.0 oz    Comment: rarely  . Drug use: No  . Sexual activity: Yes    Partners: Male    Birth control/protection: IUD  Other Topics Concern  . Not on file  Social History Narrative  . Not on file    Family History:  Family History  Problem Relation Age of Onset  . Diabetes Maternal Grandmother   . Hypothyroidism Mother    She denies any female cancers, bleeding or blood clotting disorders.    Medications Anastashia Faulkner had no medications administered during this visit. Current Outpatient Medications  Medication Sig Dispense Refill  . levonorgestrel (MIRENA) 20 MCG/24HR IUD 1 each by Intrauterine route once.     No current facility-administered medications for this visit.     Allergies Patient has no known allergies.   Physical Exam:  BP 110/75   Pulse 81  Ht 4\' 11"  (1.499 m)   Wt 128 lb (58.1 kg)   BMI 25.85 kg/m  Body mass index is 25.85 kg/m. General appearance: Well nourished, well developed female in no acute distress.  Neck:  Supple, normal appearance, and no thyromegaly  Cardiovascular: normal s1 and s2.  No murmurs, rubs or gallops. Respiratory:  Clear to auscultation bilateral. Normal respiratory effort Abdomen: positive bowel sounds and no masses, hernias; diffusely non tender to palpation, non distended Breasts: breasts appear normal, no suspicious masses, no skin or nipple changes or axillary nodes, and normal palpation. Neuro/Psych:  Normal mood and affect.  Skin:  Warm and dry.  Lymphatic:  No inguinal lymphadenopathy.   Pelvic exam: is not limited by body  habitus EGBUS: within normal limits, Vagina: within normal limits and with no blood or discharge in the vault, Cervix: normal appearing cervix without tenderness, discharge or lesions; IUD strings seen, approx 3-4cm in length. Uterus:  nonenlarged and non tender and Adnexa:  normal adnexa and no mass, fullness, tenderness Rectovaginal: deferred  Laboratory: none  Radiology: none  Assessment: pt doing well  Plan:  1. Encounter for gynecological examination Routine care. Recommend d/c GU OTC washes. Pap next year  2. BV (bacterial vaginosis) flagyl  3. Vulvovaginal candidiasis diflucan  RTC 1 year  Aletha Halim, Brooke Bonito MD Attending Center for [facility])

## 2018-03-08 ENCOUNTER — Encounter (HOSPITAL_COMMUNITY): Payer: Self-pay | Admitting: Emergency Medicine

## 2018-03-08 ENCOUNTER — Ambulatory Visit (HOSPITAL_COMMUNITY)
Admission: EM | Admit: 2018-03-08 | Discharge: 2018-03-08 | Disposition: A | Discharge status: Home / Self Care | Payer: Medicaid Other | Attending: Family Medicine | Admitting: Family Medicine

## 2018-03-08 DIAGNOSIS — H9192 Unspecified hearing loss, left ear: Secondary | ICD-10-CM

## 2018-03-08 DIAGNOSIS — H6121 Impacted cerumen, right ear: Secondary | ICD-10-CM

## 2018-03-08 DIAGNOSIS — H9201 Otalgia, right ear: Secondary | ICD-10-CM

## 2018-03-08 MED ORDER — FLUTICASONE PROPIONATE 50 MCG/ACT NA SUSP: 2.0000 | Freq: Every day | NASAL | 0 refills | Status: DC

## 2018-03-08 NOTE — Discharge Instructions (Signed)
Ear wax removed today. As discussed, may have some eustachian tube dysfunction if continues to have ear fullness, then start flonase. Otherwise, recheck as needed.

## 2018-03-08 NOTE — ED Notes (Signed)
R ear flushed out, large chunks of wax were removed.

## 2018-03-08 NOTE — ED Provider Notes (Signed)
[place]    CSN: [id number] Arrival date & time: 03/08/18  1528     History   Chief Complaint Chief Complaint  Patient presents with  . Otalgia    appt 3:30pm    HPI Monique Stone is a 32 y.o. female.   32 year old female comes in for 1 week history of right ear pain.  States this started intermittent, now more constant.  Also has ear fullness with decreased hearing.  Denies tinnitus.  Denies URI symptoms such as cough, congestion, sore throat.  Denies fever, chills, night sweats.  Denies recent swimming, recent travels.  Has not taken anything for symptoms.     Past Medical History:  Diagnosis Date  . Abnormal Pap smear   . Anemia   . Chlamydia trachomatis infection in pregnancy 08/25/08    There are no active problems to display for this patient.   Past Surgical History:  Procedure Laterality Date  . CESAREAN SECTION  2008  . CESAREAN SECTION  2010  . INDUCED ABORTION  04/2014    OB History    Gravida  3   Para  2   Term  0   Preterm  0   AB  1   Living  2     SAB  0   TAB  1   Ectopic  0   Multiple  0   Live Births  2            Home Medications    Prior to Admission medications   Medication Sig Start Date End Date Taking? Authorizing Provider  fluconazole (DIFLUCAN) 150 MG tablet Take on tab po x 1 now and repeat in 3 days Patient not taking: Reported on 03/08/2018 10/02/17   Aletha Halim, MD  fluticasone (FLONASE) 50 MCG/ACT nasal spray Place 2 sprays into both nostrils daily. 03/08/18   Ok Edwards, PA-C  levonorgestrel (MIRENA) 20 MCG/24HR IUD 1 each by Intrauterine route once.    [provider]    Family History Family History  Problem Relation Age of Onset  . Diabetes Maternal Grandmother   . Hypothyroidism Mother     Social History Social History   Tobacco Use  . Smoking status: Never Smoker  . Smokeless tobacco: Never Used  Substance Use Topics  . Alcohol use: Yes    Alcohol/week: 0.0  oz    Comment: rarely  . Drug use: No     Allergies   Patient has no known allergies.   Review of Systems Review of Systems  Reason unable to perform ROS: See HPI as above.     Physical Exam Triage Vital Signs ED Triage Vitals [03/08/18 1538]  Enc Vitals Group     BP 135/77     Pulse Rate 85     Resp 16     Temp 98.6 F (37 C)     Temp src      SpO2 100 %     Weight      Height      Head Circumference      Peak Flow      Pain Score      Pain Loc      Pain Edu?      Excl. in [place]?    No data found.  Updated Vital Signs BP 135/77   Pulse 85   Temp 98.6 F (37 C)   Resp 16   SpO2 100%   Physical Exam  Constitutional: She is oriented  to person, place, and time. She appears well-developed and well-nourished. No distress.  HENT:  Head: Normocephalic and atraumatic.  Right Ear: External ear normal.  Left Ear: Tympanic membrane, external ear and ear canal normal. Tympanic membrane is not erythematous and not bulging.  Nose: Nose normal. Right sinus exhibits no maxillary sinus tenderness and no frontal sinus tenderness. Left sinus exhibits no maxillary sinus tenderness and no frontal sinus tenderness.  Mouth/Throat: Uvula is midline, oropharynx is clear and moist and mucous membranes are normal.  No tenderness to palpation of bilateral tragus.  Patient was found to have cerumen impaction by nurse, ear irrigation prior to my examination.  Post ear irrigation, ear canal without erythema or swelling.  TM normal without erythema or bulging.  Eyes: Pupils are equal, round, and reactive to light. Conjunctivae are normal.  Neck: Normal range of motion. Neck supple.  Cardiovascular: Normal rate, regular rhythm and normal heart sounds. Exam reveals no gallop and no friction rub.  No murmur heard. Pulmonary/Chest: Effort normal and breath sounds normal. She has no decreased breath sounds. She has no wheezes. She has no rhonchi. She has no rales.  Lymphadenopathy:    She  has no cervical adenopathy.  Neurological: She is alert and oriented to person, place, and time.  Skin: Skin is warm and dry. She is not diaphoretic.  Psychiatric: She has a normal mood and affect. Her behavior is normal. Judgment normal.     UC Treatments / Results  Labs (all labs ordered are listed, but only abnormal results are displayed) Labs Reviewed - No data to display  EKG None  Radiology No results found.  Procedures Procedures (including critical care time)  Medications Ordered in UC Medications - No data to display  Initial Impression / Assessment and Plan / UC Course  I have reviewed the triage vital signs and the nursing notes.  Pertinent labs & imaging results that were available during my care of the patient were reviewed by me and considered in my medical decision making (see chart for details).    Patient with improved symptoms after ear irrigation.  Discussed with patient, if continues to have ear fullness, can try Flonase for possible eustachian tube dysfunction.  Return precautions given.  Patient expresses understanding and agrees to plan.  Final Clinical Impressions(s) / UC Diagnoses   Final diagnoses:  Right ear pain    ED Prescriptions    Medication Sig Dispense Auth. Provider   fluticasone (FLONASE) 50 MCG/ACT nasal spray Place 2 sprays into both nostrils daily. 1 g Tobin Chad, [place] 03/08/18 1611

## 2018-03-08 NOTE — ED Triage Notes (Signed)
Pt c/o R ear pain x1 week.

## 2018-06-19 ENCOUNTER — Other Ambulatory Visit (HOSPITAL_COMMUNITY)
Admission: RE | Admit: 2018-06-19 | Discharge: 2018-06-19 | Disposition: A | Discharge status: Home / Self Care | Payer: Medicaid Other | Source: Ambulatory Visit | Attending: Student in an Organized Health Care Education/Training Program | Admitting: Student in an Organized Health Care Education/Training Program

## 2018-06-19 ENCOUNTER — Other Ambulatory Visit (HOSPITAL_COMMUNITY)
Admission: RE | Admit: 2018-06-19 | Discharge: 2018-06-19 | Disposition: A | Discharge status: Home / Self Care | Payer: Medicaid Other | Source: Ambulatory Visit | Attending: Advanced Practice Midwife | Admitting: Advanced Practice Midwife

## 2018-06-19 ENCOUNTER — Other Ambulatory Visit (INDEPENDENT_AMBULATORY_CARE_PROVIDER_SITE_OTHER): Payer: Self-pay

## 2018-06-19 VITALS — BP 101/70 | HR 82 | Resp 16 | Ht 59.0 in | Wt 131.6 lb

## 2018-06-19 DIAGNOSIS — N898 Other specified noninflammatory disorders of vagina: Secondary | ICD-10-CM | POA: Diagnosis present

## 2018-06-19 DIAGNOSIS — R350 Frequency of micturition: Secondary | ICD-10-CM

## 2018-06-19 DIAGNOSIS — R358 Other polyuria: Secondary | ICD-10-CM

## 2018-06-19 DIAGNOSIS — R3589 Other polyuria: Secondary | ICD-10-CM

## 2018-06-19 LAB — POCT URINALYSIS DIP (MANUAL ENTRY)
BILIRUBIN UA: NEGATIVE mg/dL
Bilirubin, UA: NEGATIVE
GLUCOSE UA: NEGATIVE mg/dL
Nitrite, UA: NEGATIVE
Protein Ur, POC: NEGATIVE mg/dL
SPEC GRAV UA: 1.01 (ref 1.010–1.025)
UROBILINOGEN UA: 0.2 U/dL
pH, UA: 6.5 (ref 5.0–8.0)

## 2018-06-19 NOTE — Progress Notes (Signed)
SUBJECTIVE:  32 y.o. female complains of light yellowish,creamy, odorless and thin vaginal discharge for the past 3 week(s). Patient reports she did take Diflucan 150 mg on 06/05/18 then on the 06/08/18 with no relief.  Denies abnormal vaginal bleeding or significant pelvic pain or Fever. Patient reports some urine frequency.  Patient does have a history of STD.  Patient is aware her insurance does not cover this test and request that our office send the test anyway. Patient is aware she may receive a bill as well. Patient states she understands.  No LMP recorded. (Menstrual status: IUD).  OBJECTIVE:  She appears well, afebrile. Urine dipstick: positive for RBC's and positive for leukocytes.  ASSESSMENT:   Vaginal Discharge  Vaginal Itching Polyuria   PLAN:  GC, chlamydia, trichomonas, BVAG, CVAG probe sent to lab. Treatment: To be determined once lab results are received. ROV prn if symptoms persist or worsen.

## 2018-06-21 LAB — URINE CULTURE

## 2018-06-24 ENCOUNTER — Other Ambulatory Visit: Payer: Self-pay | Admitting: Advanced Practice Midwife

## 2018-06-24 LAB — CERVICOVAGINAL ANCILLARY ONLY
Bacterial vaginitis: NEGATIVE
CHLAMYDIA, DNA PROBE: NEGATIVE
Candida vaginitis: NEGATIVE
NEISSERIA GONORRHEA: NEGATIVE
TRICH (WINDOWPATH): POSITIVE — AB

## 2018-06-24 MED ORDER — METRONIDAZOLE 500 MG PO TABS: 2000.0000 mg | ORAL_TABLET | Freq: Once | ORAL | 0 refills | Status: AC

## 2018-06-24 NOTE — Progress Notes (Signed)
Positive trichomonas 10/02. Rx pharmacy of record. Will message clinic to call patient to inform her of results, medication, need for partner testing.  Mallie Snooks, CNM 06/24/18 5:35 PM

## 2018-06-25 ENCOUNTER — Telehealth: Payer: Self-pay

## 2018-06-25 NOTE — Telephone Encounter (Signed)
Per provider - called patient, advised of lab results; ABX electronically sent to pharmacy; emphasized partner needs/must be treated as well for treatment to work simultaneously. Patient stated she understood and had no questions at this time.

## 2018-06-25 NOTE — Telephone Encounter (Signed)
-----   Message from Tarry Kos sent at 06/25/2018  8:00 AM EDT ----- Regarding: FW: Trich POSITIVE   ----- Message ----- From: Darlina Rumpf, CNM Sent: 06/24/2018   5:37 PM EDT To: [place] Support Pool Subject: Trich POSITIVE                                 Please call patient to let her know she was positive for Trich. I have ordered Flagyl 2g for her and sent it to her pharmacy on record. Please make sure she understands partners can reinfect each other if they don't take treatment simultaneously. Also, condoms do not provide 100% protection from contracting again. Thank you! Maryelizabeth Kaufmann, CNM

## 2018-07-01 ENCOUNTER — Telehealth: Payer: Self-pay

## 2018-07-01 NOTE — Telephone Encounter (Signed)
-----   Message from Blanchie Dessert, [place] sent at 07/01/2018  2:09 PM EDT ----- Regarding: differnet medication Pt was prescribed a pill, states that she is not a pill taker and would like something else prescribed

## 2018-07-01 NOTE — Telephone Encounter (Signed)
Called patient to informed her there is nothing else that she can take to clear up the type of infection she has at this time. Patient voice understanding.

## 2018-07-22 ENCOUNTER — Emergency Department
Admission: EM | Admit: 2018-07-22 | Discharge: 2018-07-22 | Disposition: A | Discharge status: Home / Self Care | Payer: Medicaid Other | Attending: Emergency Medicine | Admitting: Emergency Medicine

## 2018-07-22 ENCOUNTER — Other Ambulatory Visit: Payer: Self-pay

## 2018-07-22 DIAGNOSIS — Y999 Unspecified external cause status: Secondary | ICD-10-CM | POA: Insufficient documentation

## 2018-07-22 DIAGNOSIS — Y9389 Activity, other specified: Secondary | ICD-10-CM | POA: Insufficient documentation

## 2018-07-22 DIAGNOSIS — Z79899 Other long term (current) drug therapy: Secondary | ICD-10-CM | POA: Insufficient documentation

## 2018-07-22 DIAGNOSIS — W228XXA Striking against or struck by other objects, initial encounter: Secondary | ICD-10-CM | POA: Insufficient documentation

## 2018-07-22 DIAGNOSIS — S0990XA Unspecified injury of head, initial encounter: Secondary | ICD-10-CM | POA: Insufficient documentation

## 2018-07-22 DIAGNOSIS — Y92018 Other place in single-family (private) house as the place of occurrence of the external cause: Secondary | ICD-10-CM | POA: Insufficient documentation

## 2018-07-22 NOTE — ED Provider Notes (Signed)
[facility] Emergency Department Provider Note  ____________________________________________  Time seen: Approximately 3:18 PM  I have reviewed the triage vital signs and the nursing notes.   HISTORY  Chief Complaint Head Injury    HPI Monique Stone is a 32 y.o. female that presents emergency department for evaluation of head injury yesterday.  Patient states that she opened the closet door and it hit her forehead.  She did not lose consciousness.  She had a "mild "headache this morning that significantly improved with ibuprofen.  She states that the area where the closet hit is still tender.  She states that it was swollen yesterday but the swelling has improved today.  There is still a spot that looks like a bruise.  She is not on any blood thinners.   Past Medical History:  Diagnosis Date  . Abnormal Pap smear   . Anemia   . Chlamydia trachomatis infection in pregnancy 08/25/08    There are no active problems to display for this patient.   Past Surgical History:  Procedure Laterality Date  . CESAREAN SECTION  2008  . CESAREAN SECTION  2010  . INDUCED ABORTION  04/2014    Prior to Admission medications   Medication Sig Start Date End Date Taking? Authorizing Provider  fluconazole (DIFLUCAN) 150 MG tablet Take on tab po x 1 now and repeat in 3 days Patient not taking: Reported on 03/08/2018 10/02/17   Aletha Halim, MD  fluticasone (FLONASE) 50 MCG/ACT nasal spray Place 2 sprays into both nostrils daily. 03/08/18   Ok Edwards, PA-C  levonorgestrel (MIRENA) 20 MCG/24HR IUD 1 each by Intrauterine route once.    [provider]    Allergies Patient has no known allergies.  Family History  Problem Relation Age of Onset  . Diabetes Maternal Grandmother   . Hypothyroidism Mother     Social History Social History   Tobacco Use  . Smoking status: Never Smoker  . Smokeless tobacco: Never Used  Substance Use Topics  . Alcohol use: Yes     Alcohol/week: 0.0 standard drinks    Comment: rarely  . Drug use: No     Review of Systems  Respiratory: No SOB. Gastrointestinal: No nausea, no vomiting.  Musculoskeletal: Negative for musculoskeletal pain. Skin: Negative for abrasions, lacerations. Neurological: Negative for numbness or tingling   ____________________________________________   PHYSICAL EXAM:  VITAL SIGNS: ED Triage Vitals [07/22/18 1454]  Enc Vitals Group     BP (!) 131/96     Pulse Rate 99     Resp 16     Temp 99 F (37.2 C)     Temp Source Oral     SpO2 98 %     Weight 130 lb (59 kg)     Height 4\' 11"  (1.499 m)     Head Circumference      Peak Flow      Pain Score 9     Pain Loc      Pain Edu?      Excl. in [place]?      Constitutional: Alert and oriented. Well appearing and in no acute distress. Eyes: Conjunctivae are normal. PERRL. EOMI. Head: Small mild area of ecchymosis to left forehead.  ENT:      Ears:      Nose: No congestion/rhinnorhea.      Mouth/Throat: Mucous membranes are moist.  Neck: No stridor.  Cardiovascular: Normal rate, regular rhythm.  Good peripheral circulation. Respiratory: Normal respiratory effort without tachypnea  or retractions. Lungs CTAB. Good air entry to the bases with no decreased or absent breath sounds. Musculoskeletal: Full range of motion to all extremities. No gross deformities appreciated. Neurologic:  Normal speech and language. No gross focal neurologic deficits are appreciated.  Skin:  Skin is warm, dry and intact.  Psychiatric: Mood and affect are normal. Speech and behavior are normal. Patient exhibits appropriate insight and judgement.   ____________________________________________   LABS (all labs ordered are listed, but only abnormal results are displayed)  Labs Reviewed - No data to display ____________________________________________  EKG   ____________________________________________  RADIOLOGY  No results  found.  ____________________________________________    PROCEDURES  Procedure(s) performed:    Procedures    Medications - No data to display   ____________________________________________   INITIAL IMPRESSION / ASSESSMENT AND PLAN / ED COURSE  Pertinent labs & imaging results that were available during my care of the patient were reviewed by me and considered in my medical decision making (see chart for details).  Review of the [place] CSRS was performed in accordance of the [place] prior to dispensing any controlled drugs.     Patient presented to emergency department for evaluation of head injury yesterday.  Vital signs and exam are reassuring.  No indication for imaging at this time.  Patient will continue ibuprofen.  Patient is to follow up with primary care as directed. Patient is given ED precautions to return to the ED for any worsening or new symptoms.     ____________________________________________  FINAL CLINICAL IMPRESSION(S) / ED DIAGNOSES  Final diagnoses:  Injury of head, initial encounter      NEW MEDICATIONS STARTED DURING THIS VISIT:  ED Discharge Orders    None          This chart was dictated using voice recognition software/Dragon. Despite best efforts to proofread, errors can occur which can change the meaning. Any change was purely unintentional.    Laban Emperor, PA-C 07/22/18 2323    Nena Polio, MD 07/22/18 [phone]

## 2018-07-22 NOTE — ED Notes (Signed)
See triage note  Presents with headache

## 2018-07-22 NOTE — ED Triage Notes (Signed)
Hit head on closet door yesterday, pain and mild bruising to right side of forehead. No LOC. Pt alert and oriented X4, active, cooperative, pt in NAD. RR even and unlabored, color WNL.

## 2018-12-02 ENCOUNTER — Ambulatory Visit: Payer: Medicaid Other | Admitting: Obstetrics and Gynecology

## 2019-01-02 ENCOUNTER — Ambulatory Visit: Payer: Medicaid Other | Admitting: Obstetrics and Gynecology

## 2019-03-13 ENCOUNTER — Encounter: Payer: Self-pay | Admitting: Obstetrics and Gynecology

## 2019-03-13 ENCOUNTER — Other Ambulatory Visit: Payer: Self-pay

## 2019-03-13 ENCOUNTER — Ambulatory Visit (INDEPENDENT_AMBULATORY_CARE_PROVIDER_SITE_OTHER): Payer: Medicaid Other | Admitting: Obstetrics and Gynecology

## 2019-03-13 ENCOUNTER — Other Ambulatory Visit (HOSPITAL_COMMUNITY)
Admission: RE | Admit: 2019-03-13 | Discharge: 2019-03-13 | Disposition: A | Discharge status: Home / Self Care | Payer: Medicaid Other | Source: Ambulatory Visit | Attending: Obstetrics and Gynecology | Admitting: Obstetrics and Gynecology

## 2019-03-13 VITALS — BP 110/76 | HR 80 | Ht 59.0 in | Wt 138.0 lb

## 2019-03-13 DIAGNOSIS — N898 Other specified noninflammatory disorders of vagina: Secondary | ICD-10-CM

## 2019-03-13 DIAGNOSIS — Z01419 Encounter for gynecological examination (general) (routine) without abnormal findings: Secondary | ICD-10-CM | POA: Diagnosis present

## 2019-03-13 DIAGNOSIS — Z0000 Encounter for general adult medical examination without abnormal findings: Secondary | ICD-10-CM

## 2019-03-13 NOTE — Progress Notes (Signed)
Obstetrics and Gynecology Annual Patient Evaluation  Appointment Date: 03/13/2019  OBGYN Clinic: [facility] for [facility]  Primary Care Provider: Patient, No Pcp Per  Referring Provider: No ref. provider found  Chief Complaint:  Chief Complaint  Patient presents with  . Gynecologic Exam    History of Present Illness: Monique Stone is a 33 y.o. African-American [id number] (No LMP recorded. (Menstrual status: IUD).), seen for the above chief complaint. Her past medical history is significant for nothing   +discharge   No breast s/s, abdominal pain, dysuria, hematuria, vaginal itching, dyspareunia, diarrhea, constipation, blood in BMs  Review of Systems: as noted in the History of Present Illness.    Past Medical History:  Past Medical History:  Diagnosis Date  . Abnormal Pap smear   . Anemia   . Chlamydia trachomatis infection in pregnancy 08/25/08    Past Surgical History:  Past Surgical History:  Procedure Laterality Date  . CESAREAN SECTION  2008  . CESAREAN SECTION  2010  . INDUCED ABORTION  04/2014    Past Obstetrical History:  OB History  Gravida Para Term Preterm AB Living  3 2 0 0 1 2  SAB TAB Ectopic Multiple Live Births  0 1 0 0 2    # Outcome Date GA Lbr Len/2nd Weight Sex Delivery Anes PTL Lv  3 TAB 04/26/14          2 Para 2010    M CS-Classical   LIV  1 Para 2008    F CS-Classical   LIV    Past Gynecological History: As per HPI. Periods: rare spotting History of Pap Smear(s): Yes.   Last pap 2017, which was negative History of STI(s): Yes.   She is currently using Mirena placed 08/2014 for contraception.   Social History:  Social History   Socioeconomic History  . Marital status: Single    Spouse name: Not on file  . Number of children: Not on file  . Years of education: Not on file  . Highest education level: Not on file  Occupational History  . Not on file  Social Needs  . Financial resource strain: Not on file  .  Food insecurity    Worry: Not on file    Inability: Not on file  . Transportation needs    Medical: Not on file    Non-medical: Not on file  Tobacco Use  . Smoking status: Never Smoker  . Smokeless tobacco: Never Used  Substance and Sexual Activity  . Alcohol use: Yes    Alcohol/week: 0.0 standard drinks    Comment: rarely  . Drug use: No  . Sexual activity: Yes    Partners: Male    Birth control/protection: I.U.D.  Lifestyle  . Physical activity    Days per week: Not on file    Minutes per session: Not on file  . Stress: Not on file  Relationships  . Social Herbalist on phone: Not on file    Gets together: Not on file    Attends religious service: Not on file    Active member of club or organization: Not on file    Attends meetings of clubs or organizations: Not on file    Relationship status: Not on file  . Intimate partner violence    Fear of current or ex partner: Not on file    Emotionally abused: Not on file    Physically abused: Not on file    Forced sexual activity: Not  on file  Other Topics Concern  . Not on file  Social History Narrative  . Not on file    Family History:  Family History  Problem Relation Age of Onset  . Diabetes Maternal Grandmother   . Hypothyroidism Mother     Medications Monique Stone had no medications administered during this visit. Current Outpatient Medications  Medication Sig Dispense Refill  . levonorgestrel (MIRENA) 20 MCG/24HR IUD 1 each by Intrauterine route once.    . fluconazole (DIFLUCAN) 150 MG tablet Take on tab po x 1 now and repeat in 3 days (Patient not taking: Reported on 03/08/2018) 2 tablet 1  . fluticasone (FLONASE) 50 MCG/ACT nasal spray Place 2 sprays into both nostrils daily. (Patient not taking: Reported on 03/13/2019) 1 g 0   No current facility-administered medications for this visit.     Allergies Patient has no known allergies.   Physical Exam:  BP 110/76   Pulse 80   Ht 4\' 11"   (1.499 m)   Wt 138 lb (62.6 kg)   BMI 27.87 kg/m  Body mass index is 27.87 kg/m. General appearance: Well nourished, well developed female in no acute distress.  Cardiovascular: normal s1 and s2.  No murmurs, rubs or gallops. Respiratory:  Clear to auscultation bilateral. Normal respiratory effort Abdomen: positive bowel sounds and no masses, hernias; diffusely non tender to palpation, non distended Breasts: breasts appear normal, no suspicious masses, no skin or nipple changes or axillary nodes, and normal palpation. Neuro/Psych:  Normal mood and affect.  Skin:  Warm and dry.  Lymphatic:  No inguinal lymphadenopathy.   Pelvic exam: is not limited by body habitus EGBUS: within normal limits, Vagina: within normal limits and with no blood or discharge in the vault, Cervix: normal appearing cervix without tenderness, IUD strings tucked into fornices discharge or lesions. Uterus:  nonenlarged and non tender and Adnexa:  normal adnexa and no mass, fullness, tenderness Rectovaginal: deferred  Laboratory: none  Radiology: none  Assessment: pt doing well  Plan:  1. Encounter for gynecological examination Routine care. Come back later this year for new Mirena - Cytology - PAP  2. Vaginal discharge - Cytology - PAP   RTC 5-6 months for mirena replacement  Durene Romans MD Attending Center for [facility])

## 2019-03-19 LAB — CYTOLOGY - PAP
Chlamydia: NEGATIVE
Diagnosis: NEGATIVE
HPV 16/18/45 genotyping: NEGATIVE
HPV: DETECTED — AB
Neisseria Gonorrhea: NEGATIVE

## 2019-03-27 ENCOUNTER — Encounter: Payer: Self-pay | Admitting: Obstetrics and Gynecology

## 2019-03-27 DIAGNOSIS — B977 Papillomavirus as the cause of diseases classified elsewhere: Secondary | ICD-10-CM | POA: Insufficient documentation

## 2019-04-02 ENCOUNTER — Telehealth: Payer: Self-pay

## 2019-04-02 NOTE — Telephone Encounter (Signed)
-----   Message from Aletha Halim, MD sent at 04/02/2019 11:39 AM EDT ----- I sent her a mychart message but she hasn't viewed it. Can you let her know that her pap smear is slightly abnormal and that she will need another one in Summer 2021. Thanks!

## 2019-04-02 NOTE — Telephone Encounter (Signed)
Call patient to inform her of abnormal pap and recommendation for a pap next year. Patient voice understanding.

## 2019-06-04 ENCOUNTER — Encounter (HOSPITAL_BASED_OUTPATIENT_CLINIC_OR_DEPARTMENT_OTHER): Payer: Self-pay

## 2019-06-04 ENCOUNTER — Other Ambulatory Visit: Payer: Self-pay

## 2019-06-04 ENCOUNTER — Emergency Department (HOSPITAL_BASED_OUTPATIENT_CLINIC_OR_DEPARTMENT_OTHER)
Admission: EM | Admit: 2019-06-04 | Discharge: 2019-06-05 | Disposition: A | Discharge status: Home / Self Care | Payer: Self-pay | Attending: Emergency Medicine | Admitting: Emergency Medicine

## 2019-06-04 ENCOUNTER — Emergency Department (HOSPITAL_BASED_OUTPATIENT_CLINIC_OR_DEPARTMENT_OTHER): Payer: Medicaid Other

## 2019-06-04 DIAGNOSIS — Y929 Unspecified place or not applicable: Secondary | ICD-10-CM | POA: Insufficient documentation

## 2019-06-04 DIAGNOSIS — Y93B9 Activity, other involving muscle strengthening exercises: Secondary | ICD-10-CM | POA: Insufficient documentation

## 2019-06-04 DIAGNOSIS — Y998 Other external cause status: Secondary | ICD-10-CM | POA: Insufficient documentation

## 2019-06-04 DIAGNOSIS — X509XXA Other and unspecified overexertion or strenuous movements or postures, initial encounter: Secondary | ICD-10-CM | POA: Insufficient documentation

## 2019-06-04 DIAGNOSIS — S8992XA Unspecified injury of left lower leg, initial encounter: Secondary | ICD-10-CM | POA: Insufficient documentation

## 2019-06-04 NOTE — ED Notes (Signed)
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 Going down steps and being on her feet for long periods exacerbates pain. No swelling. Pain located distally from patella on interior side. Taking biofreeze and ibuprofen with no relief.

## 2019-06-04 NOTE — ED Triage Notes (Signed)
Pt c/o pain to left knee stared 9/11 after 5 days of physical training for work-NAD-steady gait

## 2019-06-05 MED ORDER — NAPROXEN 375 MG PO TABS: ORAL_TABLET | ORAL | 0 refills | Status: DC

## 2019-06-05 MED ORDER — NAPROXEN 250 MG PO TABS
500.0000 mg | ORAL_TABLET | Freq: Once | ORAL | Status: AC
Start: 2019-06-05 — End: 2019-06-05
  Administered 2019-06-05: 500 mg via ORAL
  Filled 2019-06-05: qty 2

## 2019-06-05 NOTE — ED Provider Notes (Signed)
[place] DEPT [facility] Provider Note: Georgena Spurling, MD, FACEP  CSN: [id number] MRN: [id number] ARRIVAL: 06/04/19 at 2054 ROOM: [place]  Knee Injury   HISTORY OF PRESENT ILLNESS  06/05/19 12:46 AM Monique Stone is a 33 y.o. female who did 5 days of physical training last week.  After completing 6 days ago she noticed pain in her left knee. The pain is located medially and just distal to the medial collateral ligament.  She rates her pain is a 10 out of 10, worse with movement or weightbearing.  There is no associated swelling or deformity.  She has been taking ibuprofen without relief.  Past Medical History:  Diagnosis Date  . Abnormal Pap smear   . Anemia   . Chlamydia trachomatis infection in pregnancy 08/25/08    Past Surgical History:  Procedure Laterality Date  . CESAREAN SECTION  2008  . CESAREAN SECTION  2010  . INDUCED ABORTION  04/2014    Family History  Problem Relation Age of Onset  . Diabetes Maternal Grandmother   . Hypothyroidism Mother     Social History   Tobacco Use  . Smoking status: Never Smoker  . Smokeless tobacco: Never Used  Substance Use Topics  . Alcohol use: Yes    Alcohol/week: 0.0 standard drinks    Comment: rarely  . Drug use: No    Prior to Admission medications   Medication Sig Start Date End Date Taking? Authorizing Provider  levonorgestrel (MIRENA) 20 MCG/24HR IUD 1 each by Intrauterine route once.    [provider]  naproxen (NAPROSYN) 375 MG tablet Take 1 tablet twice daily as needed for pain. 06/05/19   Semiyah Newgent, MD  fluticasone (FLONASE) 50 MCG/ACT nasal spray Place 2 sprays into both nostrils daily. Patient not taking: Reported on 03/13/2019 03/08/18 06/05/19  Ok Edwards, PA-C    Allergies Patient has no known allergies.   REVIEW OF SYSTEMS  Negative except as noted here or in the History of Present Illness.   PHYSICAL EXAMINATION  Initial Vital Signs Blood pressure 97/70, pulse  75, temperature 99 F (37.2 C), temperature source Oral, resp. rate 18, height 4\' 11"  (1.499 m), weight 62.1 kg, SpO2 100 %.  Examination General: Well-developed, well-nourished female in no acute distress; appearance consistent with age of record; appears comfortable HENT: normocephalic; atraumatic Eyes: Normal appearance Neck: supple Heart: regular rate and rhythm Lungs: clear to auscultation bilaterally Abdomen: soft; nondistended; nontender; bowel sounds present Extremities: No deformity; full range of motion; pulses normal; no instability or pain on medial stress, lateral stress, anterior drawer test or posterior drawer test of left knee; tenderness of medial left knee distal to the medial collateral ligament without swelling, ecchymosis or erythema Neurologic: Awake, alert and oriented; motor function intact in all extremities and symmetric; no facial droop Skin: Warm and dry Psychiatric: Normal mood and affect   RESULTS  Summary of this visit's results, reviewed by myself:   EKG Interpretation  Date/Time:    Ventricular Rate:    PR Interval:    QRS Duration:   QT Interval:    QTC Calculation:   R Axis:     Text Interpretation:        Laboratory Studies: No results found for this or any previous visit (from the past 24 hour(s)). Imaging Studies: Dg Knee Complete 4 Views Left  Result Date: 06/04/2019 CLINICAL DATA:  33 year old female with medial left knee pain after rigorous activity. EXAM: LEFT KNEE - COMPLETE 4+  VIEW COMPARISON:  None. FINDINGS: Bone mineralization is within normal limits. No evidence of fracture, dislocation, or joint effusion. No evidence of arthropathy or other focal bone abnormality. Soft tissues are unremarkable. IMPRESSION: Negative. Electronically Signed   By: Genevie Ann M.D.   On: 06/04/2019 21:24    ED COURSE and MDM  Nursing notes and initial vitals signs, including pulse oximetry, reviewed.  Vitals:   06/04/19 2112 06/04/19 2323  BP:  111/68 97/70  Pulse: 76 75  Resp: 18 18  Temp: 99 F (37.2 C)   TempSrc: Oral   SpO2: 100% 100%  Weight: 62.1 kg   Height: 4\' 11"  (1.499 m)    No evidence of significant injury on exam or radiographs.  We will place a knee sleeve and treat with NSAIDs.  Will refer to sports medicine if symptoms persist.  PROCEDURES    ED DIAGNOSES     ICD-10-CM   1. Injury of left knee, initial encounter  S89.92XA        Shealynn Saulnier, Jenny Reichmann, MD 06/05/19 [phone]

## 2019-07-01 ENCOUNTER — Encounter: Payer: Self-pay | Admitting: Radiology

## 2019-08-11 ENCOUNTER — Ambulatory Visit: Payer: 59 | Admitting: Obstetrics and Gynecology

## 2019-08-28 ENCOUNTER — Ambulatory Visit: Payer: 59 | Admitting: Obstetrics and Gynecology

## 2019-09-15 ENCOUNTER — Other Ambulatory Visit: Payer: Self-pay

## 2019-09-15 ENCOUNTER — Encounter: Payer: Self-pay | Admitting: Obstetrics and Gynecology

## 2019-09-15 ENCOUNTER — Ambulatory Visit: Payer: 59 | Admitting: Obstetrics and Gynecology

## 2019-09-15 VITALS — BP 110/75 | HR 75 | Wt 130.0 lb

## 2019-09-17 NOTE — Progress Notes (Signed)
Patient here for new Mirena but per website, Verdis Frederickson is now approved for 6 years for contraception. Pt told to call in one year for removal and ask to see if it's now approved for 7 years. Will do no charge visit  Aletha Halim, Brooke Bonito MD Attending Center for [facility] (Faculty Practice) 09/15/2019

## 2020-05-04 ENCOUNTER — Other Ambulatory Visit (HOSPITAL_COMMUNITY)
Admission: RE | Admit: 2020-05-04 | Discharge: 2020-05-04 | Disposition: A | Discharge status: Home / Self Care | Payer: 59 | Source: Ambulatory Visit | Attending: Obstetrics and Gynecology | Admitting: Obstetrics and Gynecology

## 2020-05-04 ENCOUNTER — Other Ambulatory Visit: Payer: Self-pay

## 2020-05-04 ENCOUNTER — Ambulatory Visit (INDEPENDENT_AMBULATORY_CARE_PROVIDER_SITE_OTHER): Payer: 59 | Admitting: Obstetrics and Gynecology

## 2020-05-04 ENCOUNTER — Encounter: Payer: Self-pay | Admitting: Obstetrics and Gynecology

## 2020-05-04 VITALS — BP 114/80 | HR 74 | Ht 59.0 in | Wt 130.0 lb

## 2020-05-04 DIAGNOSIS — N898 Other specified noninflammatory disorders of vagina: Secondary | ICD-10-CM | POA: Diagnosis not present

## 2020-05-04 DIAGNOSIS — Z01419 Encounter for gynecological examination (general) (routine) without abnormal findings: Secondary | ICD-10-CM | POA: Diagnosis present

## 2020-05-04 NOTE — Progress Notes (Signed)
Obstetrics and Gynecology Annual Patient Evaluation  Appointment Date: 05/04/2020  OBGYN Clinic: [facility] for [facility]  Primary Care Provider: Patient, No Pcp Per  Chief Complaint:  Chief Complaint  Patient presents with  . Gynecologic Exam    History of Present Illness: Monique Stone is a 34 y.o. African-American [id number] (No LMP recorded. (Menstrual status: IUD).), seen for the above chief complaint. Her past medical history is significant for BMI 26, h/o HPV+ pap, h/o STI, h/o c-sections   Review of Systems: A comprehensive review of systems was negative.    Past Medical History:  Past Medical History:  Diagnosis Date  . Abnormal Pap smear   . Anemia   . Chlamydia trachomatis infection in pregnancy 08/25/08    Past Surgical History:  Past Surgical History:  Procedure Laterality Date  . CESAREAN SECTION  2008  . CESAREAN SECTION  2010  . INDUCED ABORTION  04/2014    Past Obstetrical History:  OB History  Gravida Para Term Preterm AB Living  3 2 0 0 1 2  SAB TAB Ectopic Multiple Live Births  0 1 0 0 2    # Outcome Date GA Lbr Len/2nd Weight Sex Delivery Anes PTL Lv  3 TAB 04/26/14          2 Para 2010    M CS-Classical   LIV  1 Para 2008    F CS-Classical   LIV    Past Gynecological History: As per HPI. Periods: none History of Pap Smear(s): Yes.   Last pap 2020, which was NILM/+HPV but 16/18/45 negative She is currently using Mirena placed 08/2014 for contraception.   Social History:  Social History   Socioeconomic History  . Marital status: Single    Spouse name: Not on file  . Number of children: Not on file  . Years of education: Not on file  . Highest education level: Not on file  Occupational History  . Not on file  Tobacco Use  . Smoking status: Never Smoker  . Smokeless tobacco: Never Used  Vaping Use  . Vaping Use: Never used  Substance and Sexual Activity  . Alcohol use: Yes    Alcohol/week: 0.0 standard drinks     Comment: rarely  . Drug use: No  . Sexual activity: Yes    Partners: Male    Birth control/protection: I.U.D.  Other Topics Concern  . Not on file  Social History Narrative  . Not on file   Social Determinants of Health   Financial Resource Strain:   . Difficulty of Paying Living Expenses:   Food Insecurity:   . Worried About Charity fundraiser in the Last Year:   . Arboriculturist in the Last Year:   Transportation Needs:   . Film/video editor (Medical):   Marland Kitchen Lack of Transportation (Non-Medical):   Physical Activity:   . Days of Exercise per Week:   . Minutes of Exercise per Session:   Stress:   . Feeling of Stress :   Social Connections:   . Frequency of Communication with Friends and Family:   . Frequency of Social Gatherings with Friends and Family:   . Attends Religious Services:   . Active Member of Clubs or Organizations:   . Attends Archivist Meetings:   Marland Kitchen Marital Status:   Intimate Partner Violence:   . Fear of Current or Ex-Partner:   . Emotionally Abused:   Marland Kitchen Physically Abused:   . Sexually Abused:  Family History:  Family History  Problem Relation Age of Onset  . Diabetes Maternal Grandmother   . Hypothyroidism Mother    Medications Avonelle Viveros had no medications administered during this visit. Current Outpatient Medications  Medication Sig Dispense Refill  . levonorgestrel (MIRENA) 20 MCG/24HR IUD 1 each by Intrauterine route once.    . naproxen (NAPROSYN) 375 MG tablet Take 1 tablet twice daily as needed for pain. (Patient not taking: Reported on 09/15/2019) 20 tablet 0   No current facility-administered medications for this visit.    Allergies Patient has no known allergies.   Physical Exam:  BP 114/80   Pulse 74   Ht 4\' 11"  (1.499 m)   Wt 130 lb (59 kg)   BMI 26.26 kg/m  Body mass index is 26.26 kg/m. General appearance: Well nourished, well developed female in no acute distress.  Neck:  Supple, normal  appearance, and no thyromegaly  Cardiovascular: normal s1 and s2.  No murmurs, rubs or gallops. Respiratory:  Clear to auscultation bilateral. Normal respiratory effort Abdomen: positive bowel sounds and no masses, hernias; diffusely non tender to palpation, non distended Breasts: breasts appear normal, no suspicious masses, no skin or nipple changes or axillary nodes, and negaative palpation. Neuro/Psych:  Normal mood and affect.  Skin:  Warm and dry.  Lymphatic:  No inguinal lymphadenopathy.   Pelvic exam: is limited by body habitus EGBUS: within normal limits Vagina: within normal limits and with no blood or discharge in the vault Cervix: normal appearing cervix without tenderness, discharge or lesions. IUD strings seen, grey 3-4cm coming from the os Uterus:  nonenlarged and non tender Adnexa:  normal adnexa and no mass, fullness, tenderness Rectovaginal: deferred  Laboratory: none  Radiology: none  Assessment: pt doing well  Plan: 1. Well woman exam with routine gynecological exam Routine care. Pt would like to come back later this year for new mirena - Cytology - PAP - Hepatitis B surface antigen - Hepatitis C antibody - HIV Antibody (routine testing w rflx) - RPR - Hemoglobin A1c - VITAMIN D 25 Hydroxy (Vit-D Deficiency, Fractures) - CBC - Comprehensive metabolic panel - Urinalysis, Routine w reflex microscopic - Lipid panel - TSH   RTC 70m for new mirena  Durene Romans MD Attending Center for [place] [facility])

## 2020-05-04 NOTE — Addendum Note (Signed)
Addended by: Aletha Halim on: 05/04/2020 09:40 AM   Modules accepted: Orders

## 2020-05-05 LAB — HEPATITIS C ANTIBODY: Hep C Virus Ab: <0.1 s/co ratio (ref 0.0–0.9)

## 2020-05-05 LAB — COMPREHENSIVE METABOLIC PANEL
ALT: 26 IU/L (ref 0–32)
AST: 18 IU/L (ref 0–40)
Albumin/Globulin Ratio: 1.9 (ref 1.2–2.2)
Albumin: 4.8 g/dL (ref 3.8–4.8)
Alkaline Phosphatase: 76 IU/L (ref 48–121)
BUN/Creatinine Ratio: 19 (ref 9–23)
BUN: 11 mg/dL (ref 6–20)
Bilirubin Total: 0.6 mg/dL (ref 0.0–1.2)
CO2: 26 mmol/L (ref 20–29)
Calcium: 9.4 mg/dL (ref 8.7–10.2)
Chloride: 100 mmol/L (ref 96–106)
Creatinine, Ser: 0.57 mg/dL (ref 0.57–1.00)
GFR calc Af Amer: 141 mL/min/{1.73_m2} (ref >59)
GFR calc non Af Amer: 122 mL/min/{1.73_m2} (ref >59)
Globulin, Total: 2.5 g/dL (ref 1.5–4.5)
Glucose: 90 mg/dL (ref 65–99)
Potassium: 4.2 mmol/L (ref 3.5–5.2)
Sodium: 139 mmol/L (ref 134–144)
Total Protein: 7.3 g/dL (ref 6.0–8.5)

## 2020-05-05 LAB — CBC
Hematocrit: 42.5 % (ref 34.0–46.6)
Hemoglobin: 14.2 g/dL (ref 11.1–15.9)
MCH: 31.8 pg (ref 26.6–33.0)
MCHC: 33.4 g/dL (ref 31.5–35.7)
MCV: 95 fL (ref 79–97)
Platelets: 338 10*3/uL (ref 150–450)
RBC: 4.46 x10E6/uL (ref 3.77–5.28)
RDW: 12 % (ref 11.7–15.4)
WBC: 5.2 10*3/uL (ref 3.4–10.8)

## 2020-05-05 LAB — LIPID PANEL
Chol/HDL Ratio: 2.8 ratio (ref 0.0–4.4)
Cholesterol, Total: 164 mg/dL (ref 100–199)
HDL: 59 mg/dL (ref >39)
LDL Chol Calc (NIH): 87 mg/dL (ref 0–99)
Triglycerides: 101 mg/dL (ref 0–149)
VLDL Cholesterol Cal: 18 mg/dL (ref 5–40)

## 2020-05-05 LAB — HEPATITIS B SURFACE ANTIGEN: Hepatitis B Surface Ag: NEGATIVE

## 2020-05-05 LAB — HIV ANTIBODY (ROUTINE TESTING W REFLEX): HIV Screen 4th Generation wRfx: NONREACTIVE

## 2020-05-05 LAB — HEMOGLOBIN A1C
Est. average glucose Bld gHb Est-mCnc: 120 mg/dL
Hgb A1c MFr Bld: 5.8 % — ABNORMAL HIGH (ref 4.8–5.6)

## 2020-05-05 LAB — TSH: TSH: 0.803 u[IU]/mL (ref 0.450–4.500)

## 2020-05-05 LAB — RPR: RPR Ser Ql: NONREACTIVE

## 2020-05-05 LAB — VITAMIN D 25 HYDROXY (VIT D DEFICIENCY, FRACTURES): Vit D, 25-Hydroxy: 29.7 ng/mL — ABNORMAL LOW (ref 30.0–100.0)

## 2020-05-06 LAB — CYTOLOGY - PAP
Chlamydia: NEGATIVE
Comment: NEGATIVE
Comment: NEGATIVE
Comment: NEGATIVE
Comment: NORMAL
Diagnosis: NEGATIVE
High risk HPV: NEGATIVE
Neisseria Gonorrhea: NEGATIVE
Trichomonas: NEGATIVE

## 2020-05-07 ENCOUNTER — Encounter: Payer: Self-pay | Admitting: Obstetrics and Gynecology

## 2020-05-07 DIAGNOSIS — R7303 Prediabetes: Secondary | ICD-10-CM | POA: Insufficient documentation

## 2020-05-07 DIAGNOSIS — R7989 Other specified abnormal findings of blood chemistry: Secondary | ICD-10-CM | POA: Insufficient documentation

## 2020-05-31 ENCOUNTER — Encounter: Payer: Self-pay | Admitting: Radiology

## 2020-08-26 ENCOUNTER — Encounter: Payer: Self-pay | Admitting: Obstetrics and Gynecology

## 2020-08-26 ENCOUNTER — Ambulatory Visit (INDEPENDENT_AMBULATORY_CARE_PROVIDER_SITE_OTHER): Payer: Self-pay | Admitting: Obstetrics and Gynecology

## 2020-08-26 ENCOUNTER — Other Ambulatory Visit: Payer: Self-pay

## 2020-08-26 VITALS — BP 112/70 | HR 79 | Ht 59.0 in | Wt 133.6 lb

## 2020-08-26 DIAGNOSIS — Z975 Presence of (intrauterine) contraceptive device: Secondary | ICD-10-CM

## 2020-08-26 NOTE — Progress Notes (Signed)
Pt presents for Mirena removal and reinsertion.

## 2020-08-26 NOTE — Progress Notes (Signed)
mirena now approved for 7 years. Patient amenorrheic on it. Will bring back in one year for annual in late 2022 and exchange mirena at that time.  Will do no charge visit.   Durene Romans MD Attending Center for [facility] (Faculty Practice) 08/26/2020 Time: [phone]

## 2021-05-10 ENCOUNTER — Ambulatory Visit: Payer: 59 | Admitting: Obstetrics and Gynecology

## 2021-05-10 ENCOUNTER — Other Ambulatory Visit (HOSPITAL_COMMUNITY)
Admission: RE | Admit: 2021-05-10 | Discharge: 2021-05-10 | Disposition: A | Discharge status: Home / Self Care | Payer: 59 | Source: Ambulatory Visit | Attending: Obstetrics and Gynecology | Admitting: Obstetrics and Gynecology

## 2021-05-10 ENCOUNTER — Ambulatory Visit (INDEPENDENT_AMBULATORY_CARE_PROVIDER_SITE_OTHER): Payer: 59 | Admitting: Obstetrics and Gynecology

## 2021-05-10 ENCOUNTER — Other Ambulatory Visit: Payer: Self-pay

## 2021-05-10 ENCOUNTER — Encounter: Payer: Self-pay | Admitting: Obstetrics and Gynecology

## 2021-05-10 VITALS — BP 96/66 | HR 77 | Ht 59.0 in | Wt 136.0 lb

## 2021-05-10 DIAGNOSIS — Z01419 Encounter for gynecological examination (general) (routine) without abnormal findings: Secondary | ICD-10-CM | POA: Diagnosis not present

## 2021-05-10 DIAGNOSIS — Z30433 Encounter for removal and reinsertion of intrauterine contraceptive device: Secondary | ICD-10-CM | POA: Diagnosis not present

## 2021-05-10 DIAGNOSIS — T8332XA Displacement of intrauterine contraceptive device, initial encounter: Secondary | ICD-10-CM

## 2021-05-10 MED ORDER — LEVONORGESTREL 20 MCG/DAY IU IUD
1.0000 | INTRAUTERINE_SYSTEM | Freq: Once | INTRAUTERINE | Status: AC
  Administered 2021-05-10: 1 via INTRAUTERINE

## 2021-05-10 NOTE — Progress Notes (Signed)
Would like to replace mirena today

## 2021-05-10 NOTE — Progress Notes (Signed)
Obstetrics and Gynecology Annual Patient Evaluation  Appointment Date: 05/10/2021  OBGYN Clinic: Center for [facility]   Chief Complaint:  Chief Complaint  Patient presents with   Gynecologic Exam  New Mirena  History of Present Illness: Monique Stone is a 35 y.o. African-American [id number] (No LMP recorded. (Menstrual status: IUD).), seen for the above chief complaint. Her past mh/o edical history is significant for h/o HPV+ pap, h/o STI, h/o c-sections  Patient has had Mirena since late 2015. Patient amenorrheic on it but for the last two months when she's had some irregular brown d/c, no pain. She would like another Mirena today  Review of Systems: Pertinent items noted in HPI and remainder of comprehensive ROS otherwise negative.    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    Past Medical History:  Past Medical History:  Diagnosis Date   Abnormal Pap smear    Anemia    Chlamydia trachomatis infection in pregnancy 08/25/08    Past Surgical History:  Past Surgical History:  Procedure Laterality Date   CESAREAN SECTION  2008   CESAREAN SECTION  2010   INDUCED ABORTION  04/2014    Past Obstetrical History:  OB History  Gravida Para Term Preterm AB Living  3 2 0 0 1 2  SAB IAB Ectopic Multiple Live Births  0 1 0 0 2    # Outcome Date GA Lbr Len/2nd Weight Sex Delivery Anes PTL Lv  3 IAB 04/26/14          2 Para 2010    M CS-Classical   LIV  1 Para 2008    F CS-Classical   LIV    Past Gynecological History: As per HPI. Pap Smear(s): Yes.   Last pap 04/2020, which was neg pap and hpv  Social History:  Social History   Socioeconomic History   Marital status: Single    Spouse name: Not on file   Number of children: Not on file   Years of education: Not on file   Highest education level: Not on file  Occupational History    Not on file  Tobacco Use   Smoking status: Never   Smokeless tobacco: Never  Vaping Use   Vaping Use: Never used  Substance and Sexual Activity   Alcohol use: Yes    Alcohol/week: 0.0 standard drinks    Comment: rarely   Drug use: No   Sexual activity: Yes    Partners: Male    Birth control/protection: I.U.D.  Other Topics Concern   Not on file  Social History Narrative   Not on file   Social Determinants of Health   Financial Resource Strain: Not on file  Food Insecurity: Not on file  Transportation Needs: Not on file  Physical Activity: Not on file  Stress: Not on file  Social Connections: Not on file  Intimate Partner Violence: Not on file    Family History:  Family History  Problem Relation Age of Onset   Diabetes Maternal Grandmother    Hypothyroidism Mother     Medications We administered levonorgestrel. Current Outpatient Medications  Medication Sig Dispense Refill   levonorgestrel (MIRENA) 20 MCG/24HR IUD 1 each by Intrauterine route once.     No current facility-administered medications for this visit.    Allergies Patient has no known allergies.   Physical Exam:  BP 96/66  Pulse 77   Ht '4\' 11"'$  (1.499 m)   Wt 136 lb (61.7 kg)   BMI 27.47 kg/m  Body mass index is 27.47 kg/m.  General appearance: Well nourished, well developed female in no acute distress.  Neck:  Supple, normal appearance, and no thyromegaly  Cardiovascular: normal s1 and s2.  No murmurs, rubs or gallops. Respiratory:  Clear to auscultation bilateral. Normal respiratory effort Abdomen: positive bowel sounds and no masses, hernias; diffusely non tender to palpation, non distended Breasts: breasts appear normal, no suspicious masses, no skin or nipple changes or axillary nodes, and normal palpation. Neuro/Psych:  Normal mood and affect.  Skin:  Warm and dry.  Lymphatic:  No inguinal lymphadenopathy.   Pelvic exam: is not limited by body habitus EGBUS: within normal  limits Vagina: within normal limits and with no blood or discharge in the vault Cervix: normal appearing cervix without tenderness, discharge or lesions. IUD strings NOT seen (3-4cm last year) Uterus:  nonenlarged and non tender Adnexa:  normal adnexa and no mass, fullness, tenderness Rectovaginal: deferred  See procedure note for IUD removal and Mirena placement  Laboratory: UPT neg  Radiology: none  Assessment: pt stable  Plan:  1. Well woman exam with routine gynecological exam For d/c; likely due to IUD shift. Patient has pcp and just had labs checked.  - Cervicovaginal ancillary only  2. Intrauterine contraceptive device threads lost, initial encounter F/u 1 month for string check   Durene Romans MD Attending Center for [facility])

## 2021-05-11 NOTE — Procedures (Signed)
Intrauterine Device (IUD) Removal Procedure Note  Prior to the procedure being performed, the patient (or guardian) was asked to state their full name, date of birth, and the type of procedure being performed. Bimanual showed uterus to be mid plane.  EGBUS normal. Vaginal vault normal. Cervix normal with IUD strings NOT seen.  Cervicovaginal swab done.  Betadine used to clean cervix. Tenaculum placed on anterior lip of cervix and os finders used to dilated cervix.  IUD hook used to tease strings down into view, and strings grasped with ringed forceps and easily removed and noted to be intact.   Intrauterine Device (IUD) Insertion Procedure Note  The patient understands the risks of IUD placement, which include but are not limited to: bleeding, infection, uterine perforation, risk of expulsion, risk of failure < 1%, increased risk of ectopic pregnancy in the event of failure.   The uterus was sounded to  6.5  cm.  The Mirena was placed without difficulty in the usual fashion.  The strings were cut to 3-4 cm.  The tenaculum was removed and cervix was found to be hemostatic.    No complications, patient tolerated the procedure well.  Durene Romans MD Attending Center for [facility] [employer] [facility])

## 2021-05-12 LAB — CERVICOVAGINAL ANCILLARY ONLY
Chlamydia: NEGATIVE
Comment: NEGATIVE
Comment: NEGATIVE
Comment: NORMAL
Neisseria Gonorrhea: NEGATIVE
Trichomonas: NEGATIVE

## 2021-06-06 ENCOUNTER — Ambulatory Visit: Payer: 59 | Admitting: Obstetrics and Gynecology

## 2022-07-28 ENCOUNTER — Other Ambulatory Visit (HOSPITAL_COMMUNITY)
Admission: RE | Admit: 2022-07-28 | Discharge: 2022-07-28 | Disposition: A | Discharge status: Home / Self Care | Payer: 59 | Source: Ambulatory Visit | Attending: Obstetrics and Gynecology | Admitting: Obstetrics and Gynecology

## 2022-07-28 ENCOUNTER — Ambulatory Visit (INDEPENDENT_AMBULATORY_CARE_PROVIDER_SITE_OTHER): Payer: 59 | Admitting: Obstetrics and Gynecology

## 2022-07-28 ENCOUNTER — Encounter: Payer: Self-pay | Admitting: Obstetrics and Gynecology

## 2022-07-28 VITALS — BP 119/81 | HR 79 | Ht 59.0 in | Wt 138.0 lb

## 2022-07-28 DIAGNOSIS — Z975 Presence of (intrauterine) contraceptive device: Secondary | ICD-10-CM | POA: Diagnosis present

## 2022-07-28 DIAGNOSIS — N921 Excessive and frequent menstruation with irregular cycle: Secondary | ICD-10-CM

## 2022-07-28 DIAGNOSIS — Z01419 Encounter for gynecological examination (general) (routine) without abnormal findings: Secondary | ICD-10-CM

## 2022-07-28 DIAGNOSIS — T8332XA Displacement of intrauterine contraceptive device, initial encounter: Secondary | ICD-10-CM

## 2022-07-28 NOTE — Progress Notes (Unsigned)
Annual  Contraction: IUD inserted  Mirena 05/10/21 STD Screening: Declined  Last pap:05/04/2020 pt wants pap. WNL  No Family Hx of Breast Cancer  CC: Bleeding w/ IUD.

## 2022-07-29 LAB — CBC
Hematocrit: 41.3 % (ref 34.0–46.6)
Hemoglobin: 13.6 g/dL (ref 11.1–15.9)
MCH: 31.5 pg (ref 26.6–33.0)
MCHC: 32.9 g/dL (ref 31.5–35.7)
MCV: 96 fL (ref 79–97)
Platelets: 344 10*3/uL (ref 150–450)
RBC: 4.32 x10E6/uL (ref 3.77–5.28)
RDW: 12.4 % (ref 11.7–15.4)
WBC: 3.9 10*3/uL (ref 3.4–10.8)

## 2022-07-29 LAB — TSH RFX ON ABNORMAL TO FREE T4: TSH: 1.64 u[IU]/mL (ref 0.450–4.500)

## 2022-07-29 LAB — BETA HCG QUANT (REF LAB): hCG Quant: <1 m[IU]/mL

## 2022-07-31 DIAGNOSIS — T8332XA Displacement of intrauterine contraceptive device, initial encounter: Secondary | ICD-10-CM | POA: Insufficient documentation

## 2022-07-31 LAB — CERVICOVAGINAL ANCILLARY ONLY
Bacterial Vaginitis (gardnerella): POSITIVE — AB
Candida Glabrata: NEGATIVE
Candida Vaginitis: NEGATIVE
Chlamydia: NEGATIVE
Comment: NEGATIVE
Comment: NEGATIVE
Comment: NEGATIVE
Comment: NEGATIVE
Comment: NEGATIVE
Comment: NORMAL
Neisseria Gonorrhea: NEGATIVE
Trichomonas: NEGATIVE

## 2022-07-31 MED ORDER — METRONIDAZOLE 500 MG PO TABS: 500.0000 mg | ORAL_TABLET | Freq: Two times a day (BID) | ORAL | 0 refills | Status: DC

## 2022-07-31 NOTE — Progress Notes (Signed)
Obstetrics and Gynecology Annual Patient Evaluation  Appointment Date: 07/28/2022  OBGYN Clinic: Center for [facility]   Primary Care Provider: Patient, No Pcp Per  Chief Complaint:  Chief Complaint  Patient presents with   Gynecologic Exam    History of Present Illness: Monique Stone is a 36 y.o. African-American [id number] (LMP: unknown), seen for the above chief complaint.   Patient had an old Mirena removed and new one place in August 2022. Since that time she says she's had AUB with it and nearly qday. Pt unsure of LMPs; she was amenorrheic with the prior Mirena  Review of Systems: Pertinent items noted in HPI and remainder of comprehensive ROS otherwise negative.    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    Past Medical History:  Past Medical History:  Diagnosis Date   Abnormal Pap smear    Anemia    Chlamydia trachomatis infection in pregnancy 08/25/08    Past Surgical History:  Past Surgical History:  Procedure Laterality Date   CESAREAN SECTION  2008   CESAREAN SECTION  2010   INDUCED ABORTION  04/2014    Past Obstetrical History:  OB History  Gravida Para Term Preterm AB Living  3 2 0 0 1 2  SAB IAB Ectopic Multiple Live Births  0 1 0 0 2    # Outcome Date GA Lbr Len/2nd Weight Sex Delivery Anes PTL Lv  3 IAB 04/26/14          2 Para 2010    M CS-Classical   LIV  1 Para 2008    F CS-Classical   LIV    Past Gynecological History: As per HPI. History of Pap Smear(s): Yes.   Last pap 04/2020, which was negative cytology and HPV  Social History:  Social History   Socioeconomic History   Marital status: Single    Spouse name: Not on file   Number of children: Not on file   Years of education: Not on file   Highest education level: Not on file  Occupational History   Not on file  Tobacco Use   Smoking  status: Never   Smokeless tobacco: Never  Vaping Use   Vaping Use: Never used  Substance and Sexual Activity   Alcohol use: Yes    Alcohol/week: 0.0 standard drinks of alcohol    Comment: rarely   Drug use: No   Sexual activity: Yes    Partners: Male    Birth control/protection: I.U.D.  Other Topics Concern   Not on file  Social History Narrative   Not on file   Social Determinants of Health   Financial Resource Strain: Not on file  Food Insecurity: Not on file  Transportation Needs: Not on file  Physical Activity: Not on file  Stress: Not on file  Social Connections: Not on file  Intimate Partner Violence: Not on file    Family History:  Family History  Problem Relation Age of Onset   Diabetes Maternal Grandmother    Hypothyroidism Mother     Medications Monique Stone had no medications administered during this visit. Current Outpatient Medications  Medication Sig Dispense Refill   levonorgestrel (MIRENA) 20 MCG/24HR IUD 1 each by Intrauterine route once.     No current facility-administered medications for this visit.    Allergies Patient has no known allergies.   Physical Exam:  BP 119/81   Pulse 79   Ht '4\' 11"'$  (1.499 m)   Wt 138 lb (62.6 kg)   BMI 27.87 kg/m  Body mass index is 27.87 kg/m. General appearance: Well nourished, well developed female in no acute distress.  Neck:  Supple, normal appearance, and no thyromegaly  Cardiovascular: normal s1 and s2.  No murmurs, rubs or gallops. Respiratory:  Clear to auscultation bilateral. Normal respiratory effort Abdomen: positive bowel sounds and no masses, hernias; diffusely non tender to palpation, non distended Breasts: patient denies any breast s/s Neuro/Psych:  Normal mood and affect.  Skin:  Warm and dry.  Lymphatic:  No inguinal lymphadenopathy.   Pelvic exam: is not limited by body habitus EGBUS: within normal limits Vagina: within normal limits and with  scant old  blood in the  vault Cervix: normal appearing cervix without tenderness, discharge or lesions. IUD strings NOT seen Uterus:  nonenlarged and non tender Adnexa:  normal adnexa and no mass, fullness, tenderness Rectovaginal: deferred  Laboratory: none  Radiology: none  Assessment: patient stable  Plan:  1. Breakthrough bleeding with IUD Will get u/s and basic labs - Cytology - PAP( [place]) - Cervicovaginal ancillary only( [place]) - CBC - TSH Rfx on Abnormal to Free T4 - Beta hCG quant (ref lab)  2. Well woman exam with routine gynecological exam Patient does not have a PCP and would like to get basic bloodwork - Cytology - PAP( [place]) - Cervicovaginal ancillary only( [place])  3. Intrauterine contraceptive device threads lost, initial encounter - CBC - TSH Rfx on Abnormal to Free T4 - Beta hCG quant (ref lab) - US PELVIC COMPLETE WITH TRANSVAGINAL; Future  Orders Placed This Encounter  Procedures   US PELVIC COMPLETE WITH TRANSVAGINAL   CBC   TSH Rfx on Abnormal to Free T4   Beta hCG quant (ref lab)    RTC f/u ater u/s  Durene Romans MD Attending Center for [facility])

## 2022-07-31 NOTE — Addendum Note (Signed)
Addended by: Aletha Halim on: 07/31/2022 02:12 PM   Modules accepted: Orders

## 2022-08-01 ENCOUNTER — Ambulatory Visit
Admission: RE | Admit: 2022-08-01 | Discharge: 2022-08-01 | Disposition: A | Discharge status: Home / Self Care | Payer: 59 | Source: Ambulatory Visit | Attending: Obstetrics and Gynecology | Admitting: Obstetrics and Gynecology

## 2022-08-01 DIAGNOSIS — T8332XA Displacement of intrauterine contraceptive device, initial encounter: Secondary | ICD-10-CM | POA: Diagnosis not present

## 2022-08-01 LAB — CYTOLOGY - PAP
Comment: NEGATIVE
Diagnosis: NEGATIVE
High risk HPV: NEGATIVE

## 2022-08-02 ENCOUNTER — Telehealth: Payer: Self-pay | Admitting: Obstetrics and Gynecology

## 2022-08-02 DIAGNOSIS — D219 Benign neoplasm of connective and other soft tissue, unspecified: Secondary | ICD-10-CM

## 2022-08-02 MED ORDER — MEGESTROL ACETATE 40 MG PO TABS: 40.0000 mg | ORAL_TABLET | Freq: Two times a day (BID) | ORAL | 1 refills | Status: DC | PRN

## 2022-08-02 NOTE — Telephone Encounter (Signed)
GYN Telephone Note  I spoke to the patient re: her u/s results. U/s shows IUD in the endometrial cavity, but sitting low and a large 8-9cm transmural fundal fibroid; she is currently not having any bleeding  No prior recent ultrasounds and patient not aware of any fibroid history. I told her that I believe that the fibroid has been there since this Mirena was placed late last year but it may have come about during the 7-8 years she had her previous Mirena.  I told her I recommend IUD removal. She states she doesn't want any more children, so I recommend removal and seeing how her bleeding is over the next 6-8 weeks. She isn't currently interested in other options for birth control. Given this, I told her if AUB resolves with Mirena removal, then can talk about a BTL. If AUB persists, then she would need an intervention addressing the fibroid and desire for permanent contraception.  Patient is amenable to plan and also to PRN megace for any prolonged bleeding in the interim  Request sent to office for Mirena removal with GYN surgeon  Durene Romans MD Attending Center for [facility]) 08/02/2022 Time: 1240pm

## 2022-08-07 ENCOUNTER — Ambulatory Visit (INDEPENDENT_AMBULATORY_CARE_PROVIDER_SITE_OTHER): Payer: 59 | Admitting: Family Medicine

## 2022-08-07 ENCOUNTER — Encounter: Payer: Self-pay | Admitting: Family Medicine

## 2022-08-07 VITALS — BP 120/78 | HR 75 | Wt 136.0 lb

## 2022-08-07 DIAGNOSIS — T8332XA Displacement of intrauterine contraceptive device, initial encounter: Secondary | ICD-10-CM

## 2022-08-07 DIAGNOSIS — N939 Abnormal uterine and vaginal bleeding, unspecified: Secondary | ICD-10-CM | POA: Diagnosis not present

## 2022-08-07 MED ORDER — NORGESTREL-ETHINYL ESTRADIOL 0.3-30 MG-MCG PO TABS: 1.0000 | ORAL_TABLET | Freq: Every day | ORAL | 1 refills | Status: DC

## 2022-08-07 NOTE — Progress Notes (Signed)
RGYN patient presents for IUD Removal today.   C/O Irregular Bleeding.

## 2022-08-09 ENCOUNTER — Ambulatory Visit: Payer: 59 | Admitting: Family Medicine

## 2022-08-14 NOTE — Progress Notes (Signed)
   GYNECOLOGY PROBLEM  VISIT ENCOUNTER NOTE  Subjective:   Monique Stone is a 36 y.o. [id number] female here for a problem GYN visit.  Current complaints: wants IUD removal because of bleeding.     Has [place] on 11/14  IMPRESSION: Two uterine leiomyomata, largest 8.5 cm diameter at uterine fundus, likely transmural.   IUD identified at the mid uterus, displaced inferiorly by leiomyoma.   Remainder of exam unremarkable.   Denies  discharge, pelvic pain, problems with intercourse or other gynecologic concerns.    Gynecologic History No LMP recorded. (Menstrual status: IUD).  Contraception: IUD  Health Maintenance Due  Topic Date Due   COVID-19 Vaccine (1) Never done   INFLUENZA VACCINE  Never done    The following portions of the patient's history were reviewed and updated as appropriate: allergies, current medications, past family history, past medical history, past social history, past surgical history and problem list.  Review of Systems Pertinent items are noted in HPI.   Objective:  BP 120/78   Pulse 75   Wt 136 lb (61.7 kg)   BMI 27.47 kg/m  Gen: well appearing, NAD HEENT: no scleral icterus CV: RR Lung: Normal WOB Ext: warm well perfused   Assessment and Plan:  1. Intrauterine contraceptive device threads lost, initial encounter Device is in the uterus by [place] 11/14 Discussed that it might be large fibroids causing the bleeding more so than the IUD. Reviewed with client risk of pregnancy with IUD removal and patient does not feel she can remember to take pills, does not want depo or nexplanon. She otherwise likes IUD and decided to keep it.   2. Abnormal uterine bleeding (AUB) Last HGB: 13.6 on 07/28/22 Discussed use of OCP to stabilize bleeding. Recommended appt with surgeon to discuss fibroid management and to see if OCP therapy helps with bleeding .  - norgestrel-ethinyl estradiol (LO/OVRAL) 0.3-30 MG-MCG tablet; Take 1 tablet by mouth daily. Take 21 days of  active medication and skip the last 7 days of the pack and start a new pack  Dispense: 84 tablet; Refill: 1  Face to face time:  25 minutes  Greater than 50% of the visit time was spent in counseling and coordination of care with the patient.  The summary and outline of the counseling and care coordination is summarized in the note above.   All questions were answered.   Please refer to After Visit Summary for other counseling recommendations.   Return in about 2 months (around 10/07/2022) for For bleeding/Fibroids possible surgery consult, MD only.  Future Appointments  Date Time Provider Department Center  10/03/2022  8:55 AM Aletha Halim, MD [facility]    Caren Macadam, MD, MPH, ABFM Attending [place] for [facility]

## 2022-10-03 ENCOUNTER — Ambulatory Visit: Payer: 59 | Admitting: Obstetrics and Gynecology

## 2022-10-17 ENCOUNTER — Ambulatory Visit: Payer: 59 | Admitting: Obstetrics and Gynecology

## 2022-11-03 ENCOUNTER — Ambulatory Visit (INDEPENDENT_AMBULATORY_CARE_PROVIDER_SITE_OTHER): Payer: 59

## 2022-11-03 ENCOUNTER — Other Ambulatory Visit (HOSPITAL_COMMUNITY)
Admission: RE | Admit: 2022-11-03 | Discharge: 2022-11-03 | Disposition: A | Discharge status: Home / Self Care | Payer: 59 | Source: Ambulatory Visit | Attending: Obstetrics and Gynecology | Admitting: Obstetrics and Gynecology

## 2022-11-03 VITALS — BP 110/78 | HR 79

## 2022-11-03 DIAGNOSIS — N898 Other specified noninflammatory disorders of vagina: Secondary | ICD-10-CM

## 2022-11-03 NOTE — Progress Notes (Signed)
SUBJECTIVE:  37 y.o. female complains of itching and vaginal discharge for 1 week(s). Denies abnormal vaginal bleeding or significant pelvic pain or fever. No UTI symptoms. Denies history of known exposure to STD.  No LMP recorded. (Menstrual status: IUD).  OBJECTIVE:  She appears well, afebrile. Urine dipstick: not done.  ASSESSMENT:  Vaginal Discharge  Vaginal Itching    PLAN:  BVAG, CVAG probe sent to lab.pt did not want STD screening due to INS.  Treatment: To be determined once lab results are received ROV prn if symptoms persist or worsen.

## 2022-11-06 LAB — CERVICOVAGINAL ANCILLARY ONLY
Bacterial Vaginitis (gardnerella): NEGATIVE
Candida Glabrata: NEGATIVE
Candida Vaginitis: POSITIVE — AB
Comment: NEGATIVE
Comment: NEGATIVE
Comment: NEGATIVE

## 2022-11-08 MED ORDER — FLUCONAZOLE 150 MG PO TABS: 150.0000 mg | ORAL_TABLET | Freq: Once | ORAL | 0 refills | Status: AC

## 2022-11-08 NOTE — Addendum Note (Signed)
Addended by: Aletha Halim on: 11/08/2022 12:54 PM   Modules accepted: Orders

## 2023-01-11 ENCOUNTER — Ambulatory Visit
Admission: RE | Admit: 2023-01-11 | Discharge: 2023-01-11 | Disposition: A | Discharge status: Home / Self Care | Payer: 59 | Source: Ambulatory Visit | Attending: Emergency Medicine | Admitting: Emergency Medicine

## 2023-01-11 VITALS — HR 95 | Temp 98.5°F | Resp 18

## 2023-01-11 DIAGNOSIS — B349 Viral infection, unspecified: Secondary | ICD-10-CM | POA: Diagnosis not present

## 2023-01-11 LAB — POCT RAPID STREP A (OFFICE): Rapid Strep A Screen: NEGATIVE

## 2023-01-11 NOTE — Discharge Instructions (Addendum)
Take Tylenol as needed for fever or discomfort.  Rest and keep yourself hydrated.    Follow-up with your primary care provider if your symptoms are not improving.     

## 2023-01-11 NOTE — ED Provider Notes (Signed)
Monique Stone    CSN: [id number] Arrival date & time: 01/11/23  4098      History   Chief Complaint Chief Complaint  Patient presents with   Sore Throat    Body aches, headache - Entered by patient    HPI Monique Stone is a 37 y.o. female.  Patient presents with 2-day history of sore throat, headache, body aches.  Treating with ibuprofen and NyQuil.  She denies fever, chills, rash, ear pain, cough, shortness of breath, chest pain, or other symptoms.  Her medical history includes prediabetes and anemia.   The history is provided by the patient and medical records.    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 Authorizing Provider  levonorgestrel (MIRENA) 20 MCG/24HR IUD 1 each by Intrauterine route once.    [provider]  megestrol (MEGACE) 40 MG tablet Take 1 tablet (40 mg total) by mouth 2 (two) times daily as needed. Use with prolonged bleeding. Can increase to two tablets twice a day in the event of heavy bleeding Patient not taking: Reported on 01/11/2023 08/02/22   Zeeland Bing, MD  metroNIDAZOLE (FLAGYL) 500 MG tablet Take 1 tablet (500 mg total) by mouth 2 (two) times daily. Patient not taking: Reported on 01/11/2023 07/31/22    [place] Bing, MD  norgestrel-ethinyl estradiol (LO/OVRAL) 0.3-30 MG-MCG tablet Take 1 tablet by mouth daily. Take 21 days of active medication and skip the last 7 days of the pack and start a new pack Patient not taking: Reported on 01/11/2023 08/07/22   Federico Flake, MD  fluticasone [facility]) 50 MCG/ACT nasal spray Place 2 sprays into both nostrils daily. Patient not taking: Reported on 03/13/2019 03/08/18 06/05/19  Belinda Fisher PA-C    Family History Family History  Problem Relation Age of Onset   Diabetes Maternal Grandmother    Hypothyroidism Mother     Social History Social History   Tobacco Use   Smoking status: Never   Smokeless tobacco: Never  Vaping Use   Vaping Use: Never used  Substance Use Topics   Alcohol use: Yes    Alcohol/week: 0.0 standard drinks of alcohol    Comment: rarely   Drug use: No     Allergies   Patient has no known allergies.   Review of Systems Review of Systems  Constitutional:  Negative for chills and fever.  HENT:  Positive for sore throat. Negative for ear pain.   Respiratory:  Negative for cough and shortness of breath.   Cardiovascular:  Negative for chest pain and palpitations.  Skin:  Negative for rash.  Neurological:  Positive for headaches.  All other systems reviewed and are negative.    Physical Exam Triage Vital Signs ED Triage Vitals [01/11/23 0947]  Enc Vitals Group     BP      Pulse Rate 95     Resp 18     Temp 98.5 F (36.9 C)     Temp src      SpO2 98 %     Weight      Height      Head Circumference      Peak Flow      Pain Score      Pain Loc      Pain Edu?      Excl. in GC?    No data found.  Updated Vital Signs Pulse 95   Temp 98.5 F (36.9 C)   Resp 18   SpO2 98%   Visual Acuity Right Eye Distance:   Left Eye Distance:   Bilateral Distance:    Right Eye Near:   Left Eye Near:    Bilateral Near:     Physical Exam Vitals and nursing note reviewed.  Constitutional:       General: She is not in acute distress.    Appearance: She is well-developed. She is not ill-appearing.  HENT:     Right Ear: Tympanic membrane normal.     Left Ear: Tympanic membrane normal.     Nose: Nose normal.     Mouth/Throat:     Mouth: Mucous membranes are moist.     Pharynx: Oropharynx is clear.     Comments: Clear PND. Cardiovascular:     Rate and Rhythm: Normal rate and regular rhythm.     Heart sounds: Normal heart sounds.  Pulmonary:     Effort: Pulmonary effort is normal. No respiratory distress.     Breath sounds: Normal breath sounds.  Musculoskeletal:     Cervical back: Neck supple.  Skin:    General: Skin is warm and dry.  Neurological:     Mental Status: She is alert.  Psychiatric:        Mood and Affect: Mood normal.        Behavior: Behavior normal.      UC Treatments / Results  Labs (all labs ordered are listed, but only abnormal results are displayed) Labs Reviewed  POCT RAPID STREP A (OFFICE)    EKG   Radiology No results found.  Procedures Procedures (including critical care time)  Medications Ordered in UC Medications - No data to display  Initial Impression / Assessment and Plan / UC Course  I have reviewed the triage vital signs and the nursing notes.  Pertinent labs & imaging results that were available during my care of the patient were reviewed by me and considered in my medical decision making (see chart for details).    Viral illness.  Rapid strep negative.  Discussed symptomatic treatment including Tylenol, rest, hydration.  Instructed patient to follow up with her PCP if symptoms are not improving.  Work note provided per patient request.  She agrees to plan of care.   Final Clinical Impressions(s) / UC Diagnoses   Final diagnoses:  Viral illness     Discharge Instructions      Take Tylenol as needed for fever or discomfort.  Rest and keep yourself hydrated.  Follow-up with your primary care provider if your  symptoms are not improving.         ED Prescriptions   None    PDMP not reviewed this encounter.   Mickie Bail, NP 01/11/23 1031

## 2023-01-11 NOTE — ED Triage Notes (Signed)
Patient to Urgent Care with complaints of sore throat/ headache/ body aches.  Symptoms started on Tuesday. Denies any known fevers. Has been taking nyquil/ motrin.

## 2023-04-07 ENCOUNTER — Encounter: Payer: Self-pay | Admitting: Emergency Medicine

## 2023-04-07 ENCOUNTER — Emergency Department: Payer: 59

## 2023-04-07 ENCOUNTER — Other Ambulatory Visit: Payer: Self-pay

## 2023-04-07 ENCOUNTER — Emergency Department
Admission: EM | Admit: 2023-04-07 | Discharge: 2023-04-07 | Disposition: A | Discharge status: Home / Self Care | Payer: 59 | Attending: Emergency Medicine | Admitting: Emergency Medicine

## 2023-04-07 DIAGNOSIS — N939 Abnormal uterine and vaginal bleeding, unspecified: Secondary | ICD-10-CM | POA: Diagnosis present

## 2023-04-07 DIAGNOSIS — R102 Pelvic and perineal pain: Secondary | ICD-10-CM | POA: Insufficient documentation

## 2023-04-07 DIAGNOSIS — D259 Leiomyoma of uterus, unspecified: Secondary | ICD-10-CM | POA: Insufficient documentation

## 2023-04-07 DIAGNOSIS — D219 Benign neoplasm of connective and other soft tissue, unspecified: Secondary | ICD-10-CM

## 2023-04-07 LAB — BASIC METABOLIC PANEL
Anion gap: 5 (ref 5–15)
BUN: 12 mg/dL (ref 6–20)
CO2: 26 mmol/L (ref 22–32)
Calcium: 8.7 mg/dL — ABNORMAL LOW (ref 8.9–10.3)
Chloride: 106 mmol/L (ref 98–111)
Creatinine, Ser: 0.76 mg/dL (ref 0.44–1.00)
GFR, Estimated: >60 mL/min (ref >60)
Glucose, Bld: 89 mg/dL (ref 70–99)
Potassium: 3.3 mmol/L — ABNORMAL LOW (ref 3.5–5.1)
Sodium: 137 mmol/L (ref 135–145)

## 2023-04-07 LAB — SAMPLE TO BLOOD BANK

## 2023-04-07 LAB — CBC
HCT: 36.4 % (ref 36.0–46.0)
Hemoglobin: 12.2 g/dL (ref 12.0–15.0)
MCH: 32.2 pg (ref 26.0–34.0)
MCHC: 33.5 g/dL (ref 30.0–36.0)
MCV: 96 fL (ref 80.0–100.0)
Platelets: 319 10*3/uL (ref 150–400)
RBC: 3.79 MIL/uL — ABNORMAL LOW (ref 3.87–5.11)
RDW: 13.2 % (ref 11.5–15.5)
WBC: 4.7 10*3/uL (ref 4.0–10.5)
nRBC: 0 % (ref 0.0–0.2)

## 2023-04-07 LAB — POC URINE PREG, ED: Preg Test, Ur: NEGATIVE

## 2023-04-07 NOTE — ED Triage Notes (Signed)
Pt in via POV, reports heavy vaginal bleeding and passing large clots x approximately 1 month.  Bleeding has increased over the last 2 days, reports going through a pad per hour.  Also reports some fatigue x 1 week.    Reports having the excessive bleeding looked into a year ago w/ the only finding being fibroids.  Ambulatory to triage, NAD noted at this time.

## 2023-04-07 NOTE — Discharge Instructions (Signed)
Your ultrasound reveals multiple fibroids, the largest of which is 8.5 cm.  Your blood counts are still normal and your vital signs are still normal.  Please follow-up with your OB/GYN to discuss further management options of your vaginal bleeding.  Please return to the emergency department for any new, worsening, or change in symptoms or other concerns.  It was a pleasure caring for you today.

## 2023-04-07 NOTE — ED Provider Notes (Signed)
[facility] Provider Note    Event Date/Time   First MD Initiated Contact with Patient 04/07/23 1520     (approximate)   History   Vaginal Bleeding   HPI  Meegan Shanafelt is a 37 y.o. female with a PMH of fibroids who presents today for evaluation of vaginal bleeding x 1 month. Patient reports that she using several pads daily. No abdominal pain, other vaginal discharge, dyspareunia, n/v/d, fevers, or chills. She had this problem in the past, and had a mirena IUD placed.   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          Physical Exam   Triage Vital Signs: ED Triage Vitals  Encounter Vitals Group     BP 04/07/23 1408 (!) 131/91     Systolic BP Percentile --      Diastolic BP Percentile --      Pulse Rate 04/07/23 1408 82     Resp 04/07/23 1408 16     Temp 04/07/23 1408 98.4 F (36.9 C)     Temp Source 04/07/23 1408 Oral     SpO2 04/07/23 1408 100 %     Weight 04/07/23 1403 140 lb (63.5 kg)     Height 04/07/23 1403 4\' 11"  (1.499 m)     Head Circumference --      Peak Flow --      Pain Score 04/07/23 1402 10     Pain Loc --      Pain Education --      Exclude from Growth Chart --     Most recent vital signs: Vitals:   04/07/23 1408 04/07/23 1720  BP: (!) 131/91 114/75  Pulse: 82 73  Resp: 16 16  Temp: 98.4 F (36.9 C)   SpO2: 100% 100%    Physical Exam Vitals and nursing note reviewed.  Constitutional:      General: Awake and alert. No acute distress.    Appearance: Normal appearance. The patient is overweight.  HENT:     Head: Normocephalic and atraumatic.     Mouth: Mucous membranes are moist.  Eyes:     General: PERRL. Normal EOMs        Right eye: No discharge.        Left eye: No discharge.     Conjunctiva/sclera: Conjunctivae normal.   Cardiovascular:     Rate and Rhythm: Normal rate and regular rhythm.     Pulses: Normal pulses.  Pulmonary:     Effort: Pulmonary effort is normal. No respiratory distress.     Breath sounds: Normal breath sounds.  Abdominal:     Abdomen is soft. There is no abdominal tenderness. No rebound or guarding. No distention. Musculoskeletal:        General: No swelling. Normal range of motion.     Cervical back: Normal range of motion and neck supple.  Skin:    General: Skin is warm and dry.     Capillary Refill: Capillary refill takes less than 2 seconds.     Findings: No rash.  Neurological:     Mental Status: The patient is awake and alert.      ED Results / Procedures / Treatments   Labs (all labs ordered are listed, but only abnormal results are displayed) Labs Reviewed  CBC -  Abnormal; Notable for the following components:      Result Value   RBC 3.79 (*)    All other components within normal limits  BASIC METABOLIC PANEL - Abnormal; Notable for the following components:   Potassium 3.3 (*)    Calcium 8.7 (*)    All other components within normal limits  POC URINE PREG, ED  SAMPLE TO BLOOD BANK     EKG     RADIOLOGY I independently reviewed and interpreted imaging and agree with radiologists findings.     PROCEDURES:  Critical Care performed:   Procedures   MEDICATIONS ORDERED IN ED: Medications - No data to display   IMPRESSION / MDM / ASSESSMENT AND PLAN / ED COURSE  I reviewed the triage vital signs and the nursing notes.   Differential diagnosis includes, but is not limited to, fibroid, dysfunctional uterine bleeding, hormonal imbalance, anemia.  Patient is awake and alert, hemodynamically stable and afebrile. She is in no acute distress.   Labs obtained reveal stable H+H. No symptomatic or clinical anemia.   I reviewed the patient's chart. US obtained 08/01/22 reveals two uterine leiomyomata, largest 8.5cm.  Given patient's reported  increase in bleeding, repeat US obtained.  Ultrasound reveals large fibroids, the largest at the fundal apex measuring approximately 8.5 cm, with an increased endometrial thickness of 1.4 cm.  We discussed the option of initiating Provera with her bleeding, though she reports that she is able to see her OB/GYN this week and prefers to wait for discussion with them which I feel is appropriate.  We did discuss strict return precautions in the meantime.  We discussed the importance of close outpatient follow-up.  Patient understands and agrees with plan.  She was discharged in stable condition.   Patient's presentation is most consistent with exacerbation of chronic illness.    FINAL CLINICAL IMPRESSION(S) / ED DIAGNOSES   Final diagnoses:  Abnormal uterine bleeding (AUB)  Fibroids     Rx / DC Orders   ED Discharge Orders     None        Note:  This document was prepared using Dragon voice recognition software and may include unintentional dictation errors.   Keturah Shavers 04/07/23 1754    Jene Every, MD 04/07/23 1840

## 2023-04-09 ENCOUNTER — Encounter: Payer: Self-pay | Admitting: Obstetrics and Gynecology

## 2023-04-24 ENCOUNTER — Encounter: Payer: Self-pay | Admitting: Obstetrics & Gynecology

## 2023-04-24 ENCOUNTER — Ambulatory Visit (INDEPENDENT_AMBULATORY_CARE_PROVIDER_SITE_OTHER): Payer: 59 | Admitting: Obstetrics & Gynecology

## 2023-04-24 ENCOUNTER — Ambulatory Visit: Payer: 59 | Admitting: Physician Assistant

## 2023-04-24 VITALS — BP 127/85 | HR 66 | Wt 141.2 lb

## 2023-04-24 DIAGNOSIS — T8332XD Displacement of intrauterine contraceptive device, subsequent encounter: Secondary | ICD-10-CM

## 2023-04-24 DIAGNOSIS — D219 Benign neoplasm of connective and other soft tissue, unspecified: Secondary | ICD-10-CM | POA: Diagnosis not present

## 2023-04-24 DIAGNOSIS — Z30432 Encounter for removal of intrauterine contraceptive device: Secondary | ICD-10-CM

## 2023-04-24 NOTE — Progress Notes (Signed)
GYNECOLOGY OFFICE VISIT NOTE  History:   Monique Stone is a 37 y.o. [id number] here today for Mirena IUD removal.  She continues to have AUB due to fibroids, had IUD placed in 05/10/21.  The strings were not visualized on previous examination, but ultrasound showed it to be in fundal location in 07/2022.   She was recently seen in ED on 04/07/23 and had another ultrasound, there was no mention of the IUD.  She reports off and on bleeding but denies any abnormal vaginal discharge,  pelvic pain or other concerns.    Past Medical History:  Diagnosis Date   Abnormal Pap smear    Anemia    Chlamydia trachomatis infection in pregnancy 08/25/08    Past Surgical History:  Procedure Laterality Date   CESAREAN SECTION  2008   CESAREAN SECTION  2010   INDUCED ABORTION  04/2014    The following portions of the patient's history were reviewed and updated as appropriate: allergies, current medications, past family history, past medical history, past social history, past surgical history and problem list.   Health Maintenance:  Normal pap and negative HRHPV on 07/28/2022.    Review of Systems:  Pertinent items noted in HPI and remainder of comprehensive ROS otherwise negative.  Physical Exam:  BP 127/85   Pulse 66   Wt 141 lb 3.2 oz (64 kg)   BMI 28.52 kg/m  CONSTITUTIONAL: Well-developed, well-nourished female in no acute distress.  HEENT:  Normocephalic, atraumatic. External right and left ear normal. No scleral icterus.  NECK: Normal range of motion, supple, no masses noted on observation SKIN: No rash noted. Not diaphoretic. No erythema. No pallor. MUSCULOSKELETAL: Normal range of motion. No edema noted. NEUROLOGIC: Alert and oriented to person, place, and time. Normal muscle tone coordination. No cranial nerve deficit noted. PSYCHIATRIC: Normal mood and affect. Normal behavior. Normal judgment and thought content. CARDIOVASCULAR: Normal heart rate noted RESPIRATORY: Effort and breath  sounds normal, no problems with respiration noted ABDOMEN: No masses noted. No other overt distention noted.   PELVIC: Normal appearing external genitalia; normal urethral meatus; normal appearing vaginal mucosa and cervix.  Scant brown discharge noted.  No IUD strings, not palpated in cervical canal using cytobrush.  Performed in the presence of a chaperone  Labs and Imaging 08/01/2022 TRANSABDOMINAL AND TRANSVAGINAL ULTRASOUND OF PELVIS CLINICAL DATA:  Lost Mirena IUD strings, unknown LMP TECHNIQUE: Both transabdominal and transvaginal ultrasound examinations of the pelvis were performed. Transabdominal technique was performed for global imaging of the pelvis including uterus, ovaries, adnexal regions, and pelvic cul-de-sac. It was necessary to proceed with endovaginal exam following the transabdominal exam to visualize the endometrium and IUD as well as ovaries.  COMPARISON:  None Available.  FINDINGS: Uterus  Measurements: 13.9 x 6.7 x 9.5 cm = volume: 466 mL. Anteverted. Heterogeneous myometrium. Large leiomyoma at fundus 8.5 x 7.3 x 7.0 cm, likely transmural. Additional smaller leiomyoma posterior mid uterus 3.0 x 2.7 x 2.7 cm  Endometrium  Obscured by leiomyoma and IUD. IUD identified at the mid uterus, displaced inferiorly by large fundal leiomyoma. No gross endometrial fluid.  Right ovary  Measurements: 4.1 x 2.0 x 2.2 cm = volume: 9.4 mL. Normal morphology without mass  Left ovary  Measurements: 3.0 x 1.7 x 2.4 cm = volume: 6.4 mL. Normal morphology without mass  Other findings  No free pelvic fluid or adnexal masses.  IMPRESSION: Two uterine leiomyomata, largest 8.5 cm diameter at uterine fundus, likely transmural.  IUD identified at the  mid uterus, displaced inferiorly by leiomyoma.  Remainder of exam unremarkable.    Electronically Signed   By: Ulyses Southward M.D.   On: 08/01/2022 09:02      Latest Ref Rng & Units 04/07/2023    2:15 PM 07/28/2022    9:17 AM  05/04/2020    9:38 AM  CBC  WBC 4.0 - 10.5 K/uL 4.7  3.9  5.2   Hemoglobin 12.0 - 15.0 g/dL 13.2  44.0  10.2   Hematocrit 36.0 - 46.0 % 36.4  41.3  42.5   Platelets 150 - 400 K/uL 319  344  338      US Pelvis Complete  Result Date: 04/07/2023 CLINICAL DATA:  Bleeding for 30 days EXAM: TRANSABDOMINAL AND TRANSVAGINAL ULTRASOUND OF PELVIS TECHNIQUE: Both transabdominal and transvaginal ultrasound examinations of the pelvis were performed. Transabdominal technique was performed for global imaging of the pelvis including uterus, ovaries, adnexal regions, and pelvic cul-de-sac. It was necessary to proceed with endovaginal exam following the transabdominal exam to visualize the uterus, endometrium, ovaries, and adnexa. COMPARISON:  08/01/2022 FINDINGS: Uterus Measurements: 12.4 x 10.0 x 9.1 cm = volume: 595 mL. Large fibroids, largest at the fundal apex measuring 8.5 cm. Endometrium Thickness: 1.4 cm.  No focal abnormality visualized. Right ovary Measurements: 3.6 x 1.4 x 1.5 cm = volume: 4 mL. Normal appearance/no adnexal mass. Left ovary Measurements: 3.2 x 1.9 x 2.4 cm = volume: 8 mL. Normal appearance/no adnexal mass. Other findings Small volume free fluid adjacent to the left ovary. IMPRESSION: 1. Large fibroids, largest at the fundal apex measuring 8.5 cm. 2. Endometrial thickness of 1.4 cm. No focal abnormality visualized. 3. Small volume nonspecific free fluid adjacent to the left ovary. 4. Normal appearance of the ovaries. Electronically Signed   By: Jearld Lesch M.D.   On: 04/07/2023 16:58   US Transvaginal Non-OB  Result Date: 04/07/2023 CLINICAL DATA:  Bleeding for 30 days EXAM: TRANSABDOMINAL AND TRANSVAGINAL ULTRASOUND OF PELVIS TECHNIQUE: Both transabdominal and transvaginal ultrasound examinations of the pelvis were performed. Transabdominal technique was performed for global imaging of the pelvis including uterus, ovaries, adnexal regions, and pelvic cul-de-sac. It was necessary to proceed  with endovaginal exam following the transabdominal exam to visualize the uterus, endometrium, ovaries, and adnexa. COMPARISON:  08/01/2022 FINDINGS: Uterus Measurements: 12.4 x 10.0 x 9.1 cm = volume: 595 mL. Large fibroids, largest at the fundal apex measuring 8.5 cm. Endometrium Thickness: 1.4 cm.  No focal abnormality visualized. Right ovary Measurements: 3.6 x 1.4 x 1.5 cm = volume: 4 mL. Normal appearance/no adnexal mass. Left ovary Measurements: 3.2 x 1.9 x 2.4 cm = volume: 8 mL. Normal appearance/no adnexal mass. Other findings Small volume free fluid adjacent to the left ovary. IMPRESSION: 1. Large fibroids, largest at the fundal apex measuring 8.5 cm. 2. Endometrial thickness of 1.4 cm. No focal abnormality visualized. 3. Small volume nonspecific free fluid adjacent to the left ovary. 4. Normal appearance of the ovaries. Electronically Signed   By: Jearld Lesch M.D.   On: 04/07/2023 16:58      Assessment and Plan:     1. Intrauterine contraceptive device threads lost, subsequent encounter 2. Fibroids Message sent to Dr. Lauralyn Primes (Radiologist) to re-review images and see if there is an IUD being obscured by fibroids (I did not see one on my review of images). Discussed that there was a possibility it has been expelled with patient.  Patient is interested in surgical management, wants to discuss this with Dr.  Pickens. She was given information about medical and surgical management options and will return to discuss with Dr. Vergie Living soon. Will await message from Dr. Jayme Cloud to see if there is any change in his read.    Please refer to After Visit Summary for other counseling recommendations.   Return for Surgical consult with Dr. Vergie Living.    I spent 30 minutes dedicated to the care of this patient including pre-visit review of records, face to face time with the patient discussing her conditions and treatments and post visit orders.    Jaynie Collins, MD, FACOG Obstetrician & Gynecologist,  [facility] for [employer], [facility]

## 2023-04-26 ENCOUNTER — Telehealth (INDEPENDENT_AMBULATORY_CARE_PROVIDER_SITE_OTHER): Payer: 59 | Admitting: Obstetrics and Gynecology

## 2023-04-26 DIAGNOSIS — T8332XD Displacement of intrauterine contraceptive device, subsequent encounter: Secondary | ICD-10-CM | POA: Diagnosis not present

## 2023-04-26 DIAGNOSIS — D219 Benign neoplasm of connective and other soft tissue, unspecified: Secondary | ICD-10-CM | POA: Diagnosis not present

## 2023-04-26 DIAGNOSIS — N939 Abnormal uterine and vaginal bleeding, unspecified: Secondary | ICD-10-CM | POA: Diagnosis not present

## 2023-04-26 MED ORDER — MEGESTROL ACETATE 40 MG PO TABS: 40.0000 mg | ORAL_TABLET | Freq: Two times a day (BID) | ORAL | 1 refills | Status: DC

## 2023-04-26 NOTE — Progress Notes (Signed)
TELEHEALTH GYNECOLOGY VISIT ENCOUNTER NOTE  Provider location: Center for [facility]   Patient location: Home  I connected with Monique Stone on 04/26/23 at  9:35 AM EDT by telephone and verified that I am speaking with the correct person using two identifiers. Patient was unable to do MyChart audiovisual encounter due to technical difficulties, she tried several times.    I discussed the limitations, risks, security and privacy concerns of performing an evaluation and management service by telephone and the availability of in person appointments. I also discussed with the patient that there may be a patient responsible charge related to this service. The patient expressed understanding and agreed to proceed.I discussed the assessment and treatment plan with the patient. The patient was provided an opportunity to ask questions and all were answered. The patient agreed with the plan and demonstrated an understanding of the instructions.    Obstetrics and Gynecology Established Patient Evaluation  Appointment Date: 04/26/2023  OBGYN Clinic: Center for [facility]  Chief Complaint: AUB  History of Present Illness: Monique Stone is a 37 y.o.  [id number], seen for the above chief complaint. Her past medical history is significant for h/o c-section x 2, fibroids, h/o mirena usage  Patient seen in the ED on 7/20 for AUB. Pt states she had bleeding starting in late June. When seen in the ED, UPT neg, CBC wnl (Hgb 12.2). u/s ordered that showed stable fibroid uterus (see below) but no IUD noted. Patient was previously amenorrheic on [place] and had an old one removed and new one placed in August 2022 but has had AUB issues it ever since.   She was seen by Dr. Macon Large on 8/6 who didn't see the IUD strings and set her up with a visit with me.  Patient states her AUB stopped over the weekend and that she has seen her PCP in the mean time who put her on  OTC iron pills because it was low; pt unsure of number  She like depo in the past but it caused weight gain and her hair to thin out and she has a hard time remembering to take pills.   Review of Systems: Pertinent items noted in HPI and remainder of comprehensive ROS otherwise negative.    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    Past Medical History:  Past Medical History:  Diagnosis Date   Abnormal Pap smear    Anemia    Chlamydia trachomatis infection in pregnancy 08/25/08    Past Surgical History:  Past Surgical History:  Procedure Laterality Date   CESAREAN SECTION  2008   CESAREAN SECTION  2010   INDUCED ABORTION  04/2014    Past Obstetrical History:  OB History  Gravida Para Term Preterm AB Living  3 2 2  0 1 2  SAB IAB Ectopic Multiple Live Births  0 1 0 0 2    # Outcome Date GA Lbr Len/2nd Weight Sex Type Anes PTL Lv  3 IAB 04/26/14          2 Term 2010    M CS-Classical   LIV  1 Term 2008    F CS-Classical   LIV   Past Gynecological History: As per HPI. History of Pap Smear(s): Yes.   Last pap 07/2022,  which was wnl/hpv neg 07/2022: neg STI swab She is currently using no method for contraception.   Social History:  Social History   Socioeconomic History   Marital status: Single    Spouse name: Not on file   Number of children: Not on file   Years of education: Not on file   Highest education level: Not on file  Occupational History   Not on file  Tobacco Use   Smoking status: Never   Smokeless tobacco: Never  Vaping Use   Vaping status: Never Used  Substance and Sexual Activity   Alcohol use: Yes    Alcohol/week: 0.0 standard drinks of alcohol    Comment: rarely   Drug use: No   Sexual activity: Yes    Partners: Male    Birth control/protection: I.U.D.  Other  Topics Concern   Not on file  Social History Narrative   Not on file   Social Determinants of Health   Financial Resource Strain: Not on file  Food Insecurity: Not on file  Transportation Needs: Not on file  Physical Activity: Not on file  Stress: Not on file  Social Connections: Unknown (01/31/2022)   Received from [facility], [facility]   Social Network    Social Network: Not on file  Intimate Partner Violence: Unknown (12/22/2021)   Received from [facility], [facility]   HITS    Physically Hurt: Not on file    Insult or Talk Down To: Not on file    Threaten Physical Harm: Not on file    Scream or Curse: Not on file    Family History:  Family History  Problem Relation Age of Onset   Diabetes Maternal Grandmother    Hypothyroidism Mother     Medications None  Allergies Patient has no known allergies.  Physical Exam:  There were no vitals taken for this visit. There is no height or weight on file to calculate BMI.  Laboratory: as per HPI  Radiology:  Narrative & Impression  CLINICAL DATA:  Bleeding for 30 days   EXAM: TRANSABDOMINAL AND TRANSVAGINAL ULTRASOUND OF PELVIS   TECHNIQUE: Both transabdominal and transvaginal ultrasound examinations of the pelvis were performed. Transabdominal technique was performed for global imaging of the pelvis including uterus, ovaries, adnexal regions, and pelvic cul-de-sac. It was necessary to proceed with endovaginal exam following the transabdominal exam to visualize the uterus, endometrium, ovaries, and adnexa.   COMPARISON:  08/01/2022   FINDINGS: Uterus   Measurements: 12.4 x 10.0 x 9.1 cm = volume: 595 mL. Large fibroids, largest at the fundal apex measuring 8.5 cm.   Endometrium   Thickness: 1.4 cm.  No focal abnormality visualized.   Right ovary   Measurements: 3.6 x 1.4 x 1.5 cm = volume: 4 mL. Normal appearance/no adnexal mass.   Left ovary   Measurements: 3.2 x 1.9 x 2.4 cm = volume: 8  mL. Normal appearance/no adnexal mass.   Other findings   Small volume free fluid adjacent to the left ovary.   IMPRESSION: 1. Large fibroids, largest at the fundal apex measuring 8.5 cm. 2. Endometrial thickness of 1.4 cm. No focal abnormality visualized. 3. Small volume nonspecific free fluid adjacent to the left ovary. 4. Normal appearance of the ovaries.     Electronically Signed   By: Jearld Lesch M.D.   On: 04/07/2023 16:58    Assessment: patient stable  Plan:  1. Intrauterine contraceptive device threads lost, subsequent encounter I d/w her that most likely it  came out with the AUB but x-ray needed to exclude it being intra-abdominal. Will have front desk schedule with patient - DG Pelvis 1-2 Views; Future  2. Fibroid Stable in size and most likely causing the AUB; at this point, patient is sure she does not want anymore children. I told her options are myomectomy (non vaginal since no e/o submucosal) which would leave her with ability to bear children in the future. Options that would pre-clude child bearing are [place], which would need a BTL or a hysterectomy. I told her that a hysterectomy would be higher risk b/c of her prior c-sections but most likely could be done laparoscopically but risk of infection, bleeding, damaging to surrounding structures, need for additional surgery, transfusion, etc.   Patient is leaning towards a hyst but I told her to consider and let me know and we will get her set up for an in office endometrial biopsy bc she needs that anyways.  She never did the megace for her bleeding last year b/c she was concerned about seeing things about how it's used for cancer but I d/w her re: this. I told her I recommend megace 40 bid and to continue this until a final plan is in place.  Patient amenable to plan.   3. Abnormal uterine bleeding (AUB)  Orders Placed This Encounter  Procedures   DG Pelvis 1-2 Views    [place] Bing, Montez Hageman  MD Attending Center for [facility])   The patient was advised to call back or seek an in-person evaluation/go to the ED if the symptoms worsen or if the condition fails to improve as anticipated.  I provided 30 minutes of non-face-to-face time during this encounter.

## 2023-04-26 NOTE — Addendum Note (Signed)
Addended by:  Bing on: 04/26/2023 11:32 AM   Modules accepted: Level of Service

## 2023-05-23 ENCOUNTER — Ambulatory Visit
Admission: RE | Admit: 2023-05-23 | Discharge: 2023-05-23 | Disposition: A | Discharge status: Home / Self Care | Payer: 59 | Source: Ambulatory Visit | Attending: Obstetrics and Gynecology | Admitting: Obstetrics and Gynecology

## 2023-05-23 ENCOUNTER — Ambulatory Visit
Admission: RE | Admit: 2023-05-23 | Discharge: 2023-05-23 | Disposition: A | Discharge status: Home / Self Care | Payer: 59 | Attending: Obstetrics and Gynecology | Admitting: Obstetrics and Gynecology

## 2023-05-23 DIAGNOSIS — T8332XD Displacement of intrauterine contraceptive device, subsequent encounter: Secondary | ICD-10-CM | POA: Diagnosis present

## 2023-05-23 DIAGNOSIS — N939 Abnormal uterine and vaginal bleeding, unspecified: Secondary | ICD-10-CM

## 2023-06-06 ENCOUNTER — Encounter: Payer: Self-pay | Admitting: Obstetrics and Gynecology

## 2023-07-06 ENCOUNTER — Other Ambulatory Visit: Payer: Self-pay | Admitting: Obstetrics and Gynecology

## 2023-07-06 DIAGNOSIS — D219 Benign neoplasm of connective and other soft tissue, unspecified: Secondary | ICD-10-CM

## 2023-07-10 ENCOUNTER — Other Ambulatory Visit: Payer: Self-pay | Admitting: Interventional Radiology

## 2023-07-10 ENCOUNTER — Other Ambulatory Visit: Payer: Self-pay | Admitting: Obstetrics and Gynecology

## 2023-07-10 DIAGNOSIS — D219 Benign neoplasm of connective and other soft tissue, unspecified: Secondary | ICD-10-CM

## 2023-08-14 ENCOUNTER — Ambulatory Visit
Admission: RE | Admit: 2023-08-14 | Discharge: 2023-08-14 | Disposition: A | Discharge status: Home / Self Care | Payer: 59 | Source: Ambulatory Visit | Attending: Interventional Radiology

## 2023-08-14 DIAGNOSIS — D219 Benign neoplasm of connective and other soft tissue, unspecified: Secondary | ICD-10-CM

## 2023-08-14 MED ORDER — GADOPICLENOL 0.5 MMOL/ML IV SOLN
7.5000 mL | Freq: Once | INTRAVENOUS | Status: AC | PRN
  Administered 2023-08-14: 6 mL via INTRAVENOUS

## 2023-08-20 ENCOUNTER — Encounter: Payer: Self-pay | Admitting: Family Medicine

## 2023-08-20 ENCOUNTER — Ambulatory Visit: Payer: 59 | Admitting: Family Medicine

## 2023-08-20 ENCOUNTER — Other Ambulatory Visit (HOSPITAL_COMMUNITY)
Admission: RE | Admit: 2023-08-20 | Discharge: 2023-08-20 | Disposition: A | Discharge status: Home / Self Care | Payer: 59 | Source: Ambulatory Visit | Attending: Family Medicine | Admitting: Family Medicine

## 2023-08-20 VITALS — BP 122/80 | HR 75 | Ht 59.0 in | Wt 143.0 lb

## 2023-08-20 DIAGNOSIS — Z01419 Encounter for gynecological examination (general) (routine) without abnormal findings: Secondary | ICD-10-CM

## 2023-08-20 DIAGNOSIS — Z113 Encounter for screening for infections with a predominantly sexual mode of transmission: Secondary | ICD-10-CM | POA: Diagnosis not present

## 2023-08-20 DIAGNOSIS — N898 Other specified noninflammatory disorders of vagina: Secondary | ICD-10-CM | POA: Insufficient documentation

## 2023-08-20 DIAGNOSIS — Z1339 Encounter for screening examination for other mental health and behavioral disorders: Secondary | ICD-10-CM | POA: Diagnosis not present

## 2023-08-20 DIAGNOSIS — L308 Other specified dermatitis: Secondary | ICD-10-CM

## 2023-08-20 DIAGNOSIS — Z124 Encounter for screening for malignant neoplasm of cervix: Secondary | ICD-10-CM

## 2023-08-20 NOTE — Progress Notes (Unsigned)
GYNECOLOGY ANNUAL PREVENTATIVE CARE ENCOUNTER NOTE  Subjective:   Monique Stone is a 37 y.o. G42P2012 female here for a routine annual gynecologic exam.  Current complaints: intermittent spotting-- had MRI for fibroids 11/26.  Reports vaginal odor on most days and feels like she had BV. Also notes dry skin patches on bilateral breasts, only present on left aerola currently. Notes these happen with changes in season. Has history of eczema Denies discharge, pelvic pain, problems with intercourse or other gynecologic concerns.    Gynecologic History No LMP recorded. (Menstrual status: Other). Contraception: none Last Pap: 2023. Results were: normal Last mammogram: NA  Health Maintenance Due  Topic Date Due   DTaP/Tdap/Td (1 - Tdap) Never done   INFLUENZA VACCINE  Never done   COVID-19 Vaccine (1 - 2023-24 season) Never done    The following portions of the patient's history were reviewed and updated as appropriate: allergies, current medications, past family history, past medical history, past social history, past surgical history and problem list.  Review of Systems Pertinent items are noted in HPI.   Objective:  BP 122/80   Pulse 75   Ht 4\' 11"  (1.499 m)   Wt 143 lb (64.9 kg)   BMI 28.88 kg/m  CONSTITUTIONAL: Well-developed, well-nourished female in no acute distress.  HENT:  Normocephalic, atraumatic, External right and left ear normal. Oropharynx is clear and moist EYES:  No scleral icterus.  NECK: Normal range of motion, supple, no masses.  Normal thyroid.  SKIN: Skin is warm and dry. No rash noted. Not diaphoretic. No erythema. No pallor. NEUROLOGIC: Alert and oriented to person, place, and time. Normal reflexes, muscle tone coordination. No cranial nerve deficit noted. PSYCHIATRIC: Normal mood and affect. Normal behavior. Normal judgment and thought content. CARDIOVASCULAR: Normal heart rate noted, regular rhythm. 2+ distal pulses. RESPIRATORY: Effort and breath sounds  normal, no problems with respiration noted. BREASTS: Symmetric in size. No masses, skin changes, nipple drainage, or lymphadenopathy. Dry, thickened patch on the left breast at 1 oclock. Slight flaking ABDOMEN: Soft,  no distention noted.  No tenderness, rebound or guarding.  PELVIC: Normal appearing external genitalia; normal appearing vaginal mucosa and cervix.  No abnormal discharge noted.  Pap smear obtained.  Normal uterine size, no other palpable masses, no uterine or adnexal tenderness. Chaperone present for exam MUSCULOSKELETAL: Normal range of motion.   Assessment and Plan:  1) Annual gynecologic examination with pap smear:  Will follow up results of pap smear and manage accordingly. STI screen also ordered today.  Routine preventative health maintenance measures emphasized.  2) Contraception counseling: none currently. Had IUD, expelled   1. Screening for cervical cancer - Cytology - PAP  2. Screening examination for venereal disease - Cervicovaginal ancillary only( [place])  3. Vaginal odor (40981) - given natural remedies sheet for BV - Reviewed timing of results and possible treatment, does not take pills well - Cervicovaginal ancillary only( [place])  4. Well woman exam with routine gynecological exam Reviewed her paps No need for mammogram at her age -- no risk factors  5. Eczema (19147) - left aerola Eczema: Exam is consistent with eczema.  - Discussed the importance of skin hydration. Recommended use of thick lotion (coco butter, eucerin) at least twice daily after all hand washing - Discussed avoiding perfumed detergents and soaps - Reviewed use of topical steroid including risk of skin thinning and discoloration with prolonged use. Reviewed the importance of no longer than 2 weeks of continual use.  Please refer to After Visit Summary for other counseling recommendations.   Return vaginal bleeding/discussion with Dr. Vergie Living.  Federico Flake,  MD, MPH, ABFM Attending Physician Center for [facility]

## 2023-08-20 NOTE — Progress Notes (Unsigned)
Patient presents for Annual.  LMP: Irregular Last pap: Date: 07/28/22 Contraception: None IUD [place] out.  Mammogram: Not yet indicated STD Screening: Accepts on pap Flu Vaccine : Declines  CC:  spotting every now and then, notes Fibroids. Had MRI last week.

## 2023-08-20 NOTE — Progress Notes (Deleted)
GYNECOLOGY ANNUAL PREVENTATIVE CARE ENCOUNTER NOTE  Subjective:   Monique Stone is a 37 y.o. G85P2012 female here for a routine annual gynecologic exam.  Current complaints: ***.     Menses are {Regular/irregular menstrual period abdominal pain hpi md:30583}  Denies abnormal vaginal bleeding, discharge, pelvic pain, problems with intercourse or other gynecologic concerns.    Gynecologic History No LMP recorded. (Menstrual status: Other). Contraception: {method:5051} Last Pap: ***. Results were: {norm/abn:16337} Last mammogram: ***. Results were: {norm/abn:16337}  Health Maintenance Due  Topic Date Due   DTaP/Tdap/Td (1 - Tdap) Never done   INFLUENZA VACCINE  Never done   COVID-19 Vaccine (1 - 2023-24 season) Never done    The following portions of the patient's history were reviewed and updated as appropriate: allergies, current medications, past family history, past medical history, past social history, past surgical history and problem list.  Review of Systems {ros; complete:30496}   Objective:  BP 122/80   Pulse 75   Ht 4\' 11"  (1.499 m)   Wt 143 lb (64.9 kg)   BMI 28.88 kg/m  CONSTITUTIONAL: Well-developed, well-nourished female in no acute distress.  HENT:  Normocephalic, atraumatic, External right and left ear normal. Oropharynx is clear and moist EYES:  No scleral icterus.  NECK: Normal range of motion, supple, no masses.  Normal thyroid.  SKIN: Skin is warm and dry. No rash noted. Not diaphoretic. No erythema. No pallor. NEUROLOGIC: Alert and oriented to person, place, and time. Normal reflexes, muscle tone coordination. No cranial nerve deficit noted. PSYCHIATRIC: Normal mood and affect. Normal behavior. Normal judgment and thought content. CARDIOVASCULAR: Normal heart rate noted, regular rhythm. 2+ distal pulses. RESPIRATORY: Effort and breath sounds normal, no problems with respiration noted. BREASTS: Symmetric in size. No masses, skin changes, nipple  drainage, or lymphadenopathy. ABDOMEN: Soft,  no distention noted.  No tenderness, rebound or guarding.  PELVIC: Normal appearing external genitalia; normal appearing vaginal mucosa and cervix.  No abnormal discharge noted.  ***Pap smear obtained.  Normal uterine size, no other palpable masses, no uterine or adnexal tenderness. Chaperone present for exam MUSCULOSKELETAL: Normal range of motion.   PROCEDURE NOTE: (***if Indicated for IUD, Nexplanon etc)    Assessment and Plan:  1) Annual gynecologic examination ***with pap smear:  Will follow up results of pap smear and manage accordingly. STI screening desired {yes/no:20286}.  Routine preventative health maintenance measures emphasized. Reviewed perimenopausal symptoms and management.   2) Contraception counseling: Reviewed all forms of birth control options available including abstinence; over the counter/barrier methods; hormonal contraceptive medication including pill, patch, ring, injection,contraceptive implant; hormonal and nonhormonal IUDs; permanent sterilization options including vasectomy and the various tubal sterilization modalities. Risks and benefits reviewed.  Questions were answered.  Written information was also given to the patient to review.  Patient desires ***, this was prescribed for patient. She will follow up in  *** for surveillance.  She was told to call with any further questions, or with any concerns about this method of contraception.  Emphasized use of condoms 100% of the time for STI prevention.  1. Screening for cervical cancer *** - Cytology - PAP  2. Screening examination for venereal disease *** - Cervicovaginal ancillary only( [place])  3. Vaginal odor *** - Cervicovaginal ancillary only( [place])  4. Well woman exam with routine gynecological exam ***   Please refer to After Visit Summary for other counseling recommendations.   No follow-ups on file.  Federico Flake, MD, MPH,  ABFM Attending Physician Center  for [facility]

## 2023-08-20 NOTE — Patient Instructions (Signed)
Natural Remedies for Bacterial Vaginosis ° °Option #1 °1 Tbsp Fractitionated Coconut Oil °10 drops of Melaleuca (Tea Tree) Oil ° °Mix ingredients together well.  Soak 3-4 tampons (in applicators) in that mixture until all or mostly all mixture is soaked up into the tampons.  Insert 1 saturated tampon vaginally and wear overnight for 3-4 nights.   ° °Option #2 (sometimes to be used in conjunction with option #1) °Fill tub with enough to cover lap/lower abdomen warm water.  Mix 1/2 cup of baking soda in water.  Soak in water/baking soda mixture for at least 20 minutes.  Be sure to swish water in between legs to get as much in vagina as possible.  This soak should be done after sexual intercourse and menstrual cycles.   ° ° °Option #3 (sometimes to be used in conjunction with option #1 and 2) °Fill tub with enough to cover lap/lower abdomen warm water.  Mix 2-4 cup of apple cider vinegar in water.  Soak in water/vinegar mixture for at least 20 minutes.  Be sure to swish water in between legs to get as much in vagina as possible.  This soak should be done after sexual intercourse and menstrual cycles.   ° °GO WHITE: °Soap: UNSCENTED Dove (white box light green writing) °Laundry detergent (underwear)- Dreft or [employer] unscented °WHITE 100% cotton panties (NOT just cotton crouch) °Sanitary napkin/panty liners: UNSCENTED.  If it doesn't SAY unscented it can have a scent/perfume    °NO PERFUMES OR LOTIONS OR POTIONS in the vulvar area (may use regular [place]) °Condoms: hypoallergenic only. Non dyed (no color) °Toilet papers: white only °Wash clothes: use a separate wash cloth. WHITE.  Wash in Dreft.  ° °You can purchase Tea Tree Oil locally at: ° °[employer] °[address] °[place] [address] ° °[employer] °[address] °[place] [address] ° °Advise that these alternatives will not replace the need to be evaluated if symptoms persist. You will need to seek care at an OB/GYN provider. ° °

## 2023-08-22 LAB — CYTOLOGY - PAP
Chlamydia: NEGATIVE
Comment: NEGATIVE
Comment: NEGATIVE
Comment: NEGATIVE
Comment: NEGATIVE
Comment: NORMAL
Diagnosis: NEGATIVE
HPV 16: NEGATIVE
HPV 18 / 45: NEGATIVE
High risk HPV: POSITIVE — AB
Neisseria Gonorrhea: NEGATIVE

## 2023-08-22 LAB — CERVICOVAGINAL ANCILLARY ONLY
Bacterial Vaginitis (gardnerella): POSITIVE — AB
Candida Glabrata: NEGATIVE
Candida Vaginitis: NEGATIVE
Comment: NEGATIVE
Comment: NEGATIVE
Comment: NEGATIVE

## 2023-09-04 ENCOUNTER — Inpatient Hospital Stay
Admission: RE | Admit: 2023-09-04 | Discharge: 2023-09-04 | Discharge status: Home / Self Care | Payer: 59 | Source: Ambulatory Visit | Attending: Obstetrics and Gynecology

## 2023-09-04 DIAGNOSIS — D219 Benign neoplasm of connective and other soft tissue, unspecified: Secondary | ICD-10-CM

## 2023-09-04 HISTORY — PX: IR RADIOLOGIST EVAL & MGMT: IMG5224

## 2023-09-04 NOTE — Consult Note (Signed)
Chief Complaint: Patient was consulted remotely today (TeleHealth) for uterine fibroids and menorrhagia at the request of Pickens,Charlie.    Referring Physician(s): Pickens,Charlie  History of Present Illness: Monique Stone is a 37 y.o. female with a 1 year history of uterine fibroids.  Her primary symptom is severe menorrhagia.  Her bleeding is very heavy with passage of large clots.  She requires both superabsorbent pads and tampons changed every hour.  Unfortunately, her blood loss has resulted in iron deficiency anemia and she is taking iron supplementation.  She tried IUD therapy but the volume of bleeding caused the IUD to come out with her menstrual flow.  She has spoken to her gynecologist about the options of hysterectomy and myomectomy and is currently not interested in pursuing surgical therapy.  Past Medical History:  Diagnosis Date   Abnormal Pap smear    Anemia    Chlamydia trachomatis infection in pregnancy 08/25/08    Past Surgical History:  Procedure Laterality Date   CESAREAN SECTION  2008   CESAREAN SECTION  2010   INDUCED ABORTION  04/2014   IR RADIOLOGIST EVAL & MGMT  09/04/2023    Allergies: Patient has no known allergies.  Medications: Prior to Admission medications   Medication Sig Start Date End Date Taking? Authorizing Provider  megestrol (MEGACE) 40 MG tablet Take 1 tablet (40 mg total) by mouth 2 (two) times daily. 04/26/23   [place] Bing, MD  metroNIDAZOLE (FLAGYL) 500 MG tablet Take 1 tablet (500 mg total) by mouth 2 (two) times daily. Patient not taking: Reported on 01/11/2023 07/31/22   [place] Bing, MD  fluticasone [facility]) 50 MCG/ACT nasal spray Place 2 sprays into both nostrils daily. Patient not taking: Reported on 03/13/2019 03/08/18 06/05/19  Belinda Fisher, PA-C     Family History  Problem Relation Age of Onset   Diabetes Maternal Grandmother    Hypothyroidism Mother     Social History   Socioeconomic History   Marital  status: Single    Spouse name: Not on file   Number of children: Not on file   Years of education: Not on file   Highest education level: Not on file  Occupational History   Not on file  Tobacco Use   Smoking status: Never   Smokeless tobacco: Never  Vaping Use   Vaping status: Never Used  Substance and Sexual Activity   Alcohol use: Yes    Alcohol/week: 0.0 standard drinks of alcohol    Comment: rarely   Drug use: No   Sexual activity: Yes    Partners: Male  Other Topics Concern   Not on file  Social History Narrative   Not on file   Social Drivers of Health   Financial Resource Strain: Not on file  Food Insecurity: Not on file  Transportation Needs: Not on file  Physical Activity: Not on file  Stress: Not on file  Social Connections: Unknown (01/31/2022)   [facility]   Social Network    Social Network: Not on file   Review of Systems  Review of Systems: A 12 point ROS discussed and pertinent positives are indicated in the HPI above.  All other systems are negative.    Physical Exam No direct physical exam was performed (except for noted visual exam findings with Video Visits).    Vital Signs: There were no vitals taken for this visit.  Imaging: IR Radiologist Eval & Mgmt Result Date: 09/04/2023 EXAM: NEW PATIENT OFFICE VISIT  CHIEF COMPLAINT: SEE NOTE IN EPIC HISTORY OF PRESENT ILLNESS: SEE NOTE IN EPIC REVIEW OF SYSTEMS: SEE NOTE IN EPIC PHYSICAL EXAMINATION: SEE NOTE IN EPIC ASSESSMENT AND PLAN: SEE NOTE IN EPIC Electronically Signed   By: Malachy Moan M.D.   On: 09/04/2023 13:57   MR PELVIS W WO CONTRAST Result Date: 08/25/2023 CLINICAL DATA:  Fibroids, persistent pain EXAM: MRI PELVIS WITHOUT AND WITH CONTRAST TECHNIQUE: Multiplanar multisequence MR imaging of the pelvis was performed both before and after administration of intravenous contrast. CONTRAST:  6 mL Vueway gadolinium contrast IV COMPARISON:  None Available.  FINDINGS: Urinary Tract:  No abnormality visualized. Bowel:  Unremarkable visualized pelvic bowel loops. Vascular/Lymphatic: No pathologically enlarged lymph nodes. No significant vascular abnormality seen. Reproductive: Fibroid uterus, overall dimensions 13.5 x 8.5 x 11.4 cm (series 7, image 15, series 3, image 11). The uterus is retroflexed by a dominant, enhancing bulky submucosal fibroid of the anterior uterine fundus, measuring 9.5 x 8.3 x 9.0 cm (series 3, image 14, series 7, image 13). This almost completely effaces the endometrial cavity. IUD is present in the fundal endometrial cavity, rotated to the left (series 10, image 47). Multiple additional much smaller intramural fibroids. Multiple small benign functional ovarian follicles, for which no specific further follow-up or characterization is required. Other:  None. Musculoskeletal: No suspicious bone lesions identified. IMPRESSION: 1. Fibroid uterus, overall dimensions 13.5 x 8.5 x 11.4 cm. 2. The uterus is retroflexed by a dominant, enhancing bulky submucosal fibroid of the anterior uterine fundus, measuring 9.5 x 8.3 x 9.0 cm. This almost completely effaces the endometrial cavity. 3. Multiple additional much smaller intramural fibroids. 4. IUD is present in the fundal endometrial cavity, rotated to the left. Electronically Signed   By: Jearld Lesch M.D.   On: 08/25/2023 18:21    Labs:  CBC: Recent Labs    04/07/23 1415  WBC 4.7  HGB 12.2  HCT 36.4  PLT 319    COAGS: No results for input(s): "INR", "APTT" in the last 8760 hours.  BMP: Recent Labs    04/07/23 1415  NA 137  K 3.3*  CL 106  CO2 26  GLUCOSE 89  BUN 12  CALCIUM 8.7*  CREATININE 0.76  GFRNONAA >60    LIVER FUNCTION TESTS: No results for input(s): "BILITOT", "AST", "ALT", "ALKPHOS", "PROT", "ALBUMIN" in the last 8760 hours.  TUMOR MARKERS: No results for input(s): "AFPTM", "CEA", "CA199", "CHROMGRNA" in the last 8760 hours.  Assessment and  Plan:  Pleasant 37 year old female with highly symptomatic uterine fibroids.  Her primary symptom is menorrhagia which has resulted in anemia requiring iron supplementation.  She is currently not interested in operative hysterectomy or myomectomy.  We discussed the risks, benefits and alternatives to minimally invasive therapy with uterine artery embolization at length as well as the natural history of uterine fibroids.  She would like to proceed with uterine artery embolization to relieve her symptoms.  1.) Please schedule for out-patient superior hypogastric nerve block and uterine artery embolization.  Patient will require 2 weeks off from work for recovery and may require short-term FMLA.   Thank you for this interesting consult.  I greatly enjoyed meeting Monique Stone and look forward to participating in their care.  A copy of this report was sent to the requesting provider on this date.  Electronically Signed: BRYNLEA KREISHER 09/04/2023, 2:50 PM   I spent a total of 40 Minutes  in remote  clinical consultation, greater than 50% of which was counseling/coordinating  care for uterine fibroids, menorrhagia and anemia.    Visit type: Audio only (telephone). Audio (no video) only due to patient request, unable to attend in person. Alternative for in-person consultation at [facility], [address] [place], [place], [place]. This visit type was conducted due to national recommendations for restrictions regarding the COVID-19 Pandemic (e.g. social distancing).  This format is felt to be most appropriate for this patient at this time.  All issues noted in this document were discussed and addressed.

## 2023-10-10 ENCOUNTER — Encounter: Payer: Self-pay | Admitting: Family Medicine

## 2023-10-10 DIAGNOSIS — N760 Acute vaginitis: Secondary | ICD-10-CM

## 2023-10-11 MED ORDER — METRONIDAZOLE 500 MG PO TABS: 500.0000 mg | ORAL_TABLET | Freq: Two times a day (BID) | ORAL | 0 refills | Status: DC

## 2023-11-15 ENCOUNTER — Other Ambulatory Visit: Payer: Self-pay | Admitting: Interventional Radiology

## 2023-11-15 DIAGNOSIS — D259 Leiomyoma of uterus, unspecified: Secondary | ICD-10-CM

## 2023-12-19 ENCOUNTER — Encounter: Payer: Self-pay | Admitting: Family Medicine

## 2023-12-19 ENCOUNTER — Other Ambulatory Visit: Payer: Self-pay | Admitting: *Deleted

## 2023-12-19 MED ORDER — METRONIDAZOLE 0.75 % VA GEL: 1.0000 | Freq: Every day | VAGINAL | 1 refills | Status: DC

## 2023-12-31 ENCOUNTER — Telehealth: Payer: Self-pay

## 2023-12-31 MED ORDER — DOCUSATE SODIUM 100 MG PO CAPS: 100.0000 mg | ORAL_CAPSULE | Freq: Two times a day (BID) | ORAL | 0 refills | Status: AC

## 2023-12-31 MED ORDER — PROMETHAZINE HCL 12.5 MG PO TABS: 12.5000 mg | ORAL_TABLET | ORAL | 0 refills | Status: DC | PRN

## 2023-12-31 MED ORDER — NAPROXEN 500 MG PO TABS: 500.0000 mg | ORAL_TABLET | Freq: Two times a day (BID) | ORAL | 0 refills | Status: AC

## 2023-12-31 MED ORDER — ONDANSETRON HCL 8 MG PO TABS: 8.0000 mg | ORAL_TABLET | Freq: Three times a day (TID) | ORAL | 0 refills | Status: DC | PRN

## 2023-12-31 NOTE — Discharge Instructions (Signed)
 Uterine Artery Embolization After Care   What can I expect after the procedure?   After the procedure, it is common to have:   Mild pain or discomfort at the arterial entry site   Uterine Cramping   Cramps can vary in strength from what you would consider to be a bad menstrual cycle all the way up to as severe as labor pains.   The cramping is typically the most severe the afternoon and evening the day of the procedure and begin to improve the next day and each day thereafter.   Vaginal bleeding. Your cycle may become irregular the first several months.   Vaginal discharge. We recommend you wear a panty liner for first 4-6 weeks following your procedure.    Nausea and vomiting.      Follow these instructions at home:   Medicines   Take your medicine exactly as told, at the same time every day. This is vital to helping you with a smooth recovery.   Zofran (ondansetron) is used to prevent nausea before it starts.  You will have a prescription to take 8 mg of this medication every 8 hours.  You should take this even if you don't feel nauseated because it is meant to prevent the nausea from occurring.  Once you get nauseated and start to vomit, you may not be able to keep your other medicines down and your pain can be left untreated.  You can take this with every other dose of the oxycodone/acetaminophen   Naproxen is a non-steroidal anti-inflammatory medicine called and is critical in keeping your inflammation and pain under control.  You must take this every 12 hrs. We recommend 8 am and 8 pm.    Percocet (oxycodone/acetaminophen) is a combination narcotic pain medication with Tylenol.  This is to help with your pain.  Take it every 4 hours regardless of your pain level the first 2 days.  Set an alarm to wake up so you don't miss a dose overnight and get behind on your pain control.  After the first 48 hrs, if your pain is minimal you can take only as needed.    Colace (docusate  sodium) is a stool softener to help prevent constipation.  The pain medications often cause constipation which can be particularly uncomfortable after [place].  We recommend you take this at 8 am and 8 pm with your naproxen.    Phenergan (promethazine) is another medication for nausea.  If you still feel sick to your stomach or vomit despite the Zofran (ondansetron) take this medicine every 8 hours as needed.    Incision care   Leave your bandage on for 24 hrs and keep the area dry   You may remove the bandage after 24 hrs and then shower as normal.    Do not submerge in a bath, pool or hot tub until the small incision is completely healed (5-7 days).   Activity   Do not lift anything that is heavier than 5 lb (2.3 kg)for the first 3 days.     Return to your normal activities after day 5.  Take it slowly and listen to your body. Ask your health care provider what activities are safe for you.   General instructions   Many women find a hot water bottle or heating pad helpful when placed on the lower abdomen.  This is fine to do if it helps your cramps.    Do not use any products that contain nicotine or tobacco.  These products include cigarettes, chewing tobacco, and vaping devices, such as e-cigarettes. These can delay incision healing. If you need help quitting, ask your health care provider.   Do not have sex or put anything in your vagina for at least 14 days.    Do not drink alcohol until your health care provider says it is okay.   Keep all follow-up visits. This is important.      Please contact our office at [phone] for the following symptoms:   You have a fever.   You have more redness, swelling, or pain around your incision.   You have more fluid or blood coming from your incision site.   Your incision feels warm to the touch.   You have pus or a bad smell coming from your incision or vagina.   You have a rash.   You have nausea, or you cannot eat or drink  anything without vomiting.   You have a vaginal discharge that is not getting lighter.   Get help right away if:   You have trouble breathing.   You have chest pain.   You have severe pain in your abdomen, and it does not get better with medicine.   You have leg pain or leg swelling.   You feel dizzy, or you faint.   Do not wait to see if the symptoms will go away.   Do not drive yourself to the hospital.      These symptoms may be an emergency.    Get help right away. Call 911.   Summary   After the procedure, it is common to have cramps, or pain or discomfort at the incision site. You will be given pain medicine.   Follow instructions from your health care provider about how to take care of your incision. Check your incision area every day for signs of infection.   Take over-the-counter and prescription medicines only as told by your health care provider.   Contact your health care provider if you have symptoms of infection or other symptoms that do not get better with treatment.   Thanks for visiting [facility] [place] [facility]!Uterine Artery Embolization After Care   What can I expect after the procedure?   After the procedure, it is common to have:   Mild pain or discomfort at the arterial entry site   Uterine Cramping   Cramps can vary in strength from what you would consider to be a bad menstrual cycle all the way up to as severe as labor pains.   The cramping is typically the most severe the afternoon and evening the day of the procedure and begin to improve the next day and each day thereafter.   Vaginal bleeding. Your cycle may become irregular the first several months.   Vaginal discharge. We recommend you wear a panty liner for first 4-6 weeks following your procedure.    Nausea and vomiting.      Follow these instructions at home:   Medicines   Take your medicine exactly as told, at the same time every day. This is vital to helping you with a smooth  recovery.   Zofran (ondansetron) is used to prevent nausea before it starts.  You will have a prescription to take 8 mg of this medication every 8 hours.  You should take this even if you don't feel nauseated because it is meant to prevent the nausea from occurring.  Once you get nauseated and start to vomit, you may not be able  to keep your other medicines down and your pain can be left untreated.  You can take this with every other dose of the oxycodone/acetaminophen   Naproxen is a non-steroidal anti-inflammatory medicine called and is critical in keeping your inflammation and pain under control.  You must take this every 12 hrs. We recommend 8 am and 8 pm.    Percocet (oxycodone/acetaminophen) is a combination narcotic pain medication with Tylenol.  This is to help with your pain.  Take it every 4 hours regardless of your pain level the first 2 days.  Set an alarm to wake up so you don't miss a dose overnight and get behind on your pain control.  After the first 48 hrs, if your pain is minimal you can take only as needed.    Colace (docusate sodium) is a stool softener to help prevent constipation.  The pain medications often cause constipation which can be particularly uncomfortable after [place].  We recommend you take this at 8 am and 8 pm with your naproxen.    Phenergan (promethazine) is another medication for nausea.  If you still feel sick to your stomach or vomit despite the Zofran (ondansetron) take this medicine every 8 hours as needed.    Incision care   Leave your bandage on for 24 hrs and keep the area dry   You may remove the bandage after 24 hrs and then shower as normal.    Do not submerge in a bath, pool or hot tub until the small incision is completely healed (5-7 days).   Activity   Do not lift anything that is heavier than 5 lb (2.3 kg)for the first 3 days.     Return to your normal activities after day 5.  Take it slowly and listen to your body. Ask your health care  provider what activities are safe for you.   General instructions   Many women find a hot water bottle or heating pad helpful when placed on the lower abdomen.  This is fine to do if it helps your cramps.    Do not use any products that contain nicotine or tobacco. These products include cigarettes, chewing tobacco, and vaping devices, such as e-cigarettes. These can delay incision healing. If you need help quitting, ask your health care provider.   Do not have sex or put anything in your vagina for at least 14 days.    Do not drink alcohol until your health care provider says it is okay.   Keep all follow-up visits. This is important.      Please contact our office at [phone] for the following symptoms:   You have a fever.   You have more redness, swelling, or pain around your incision.   You have more fluid or blood coming from your incision site.   Your incision feels warm to the touch.   You have pus or a bad smell coming from your incision or vagina.   You have a rash.   You have nausea, or you cannot eat or drink anything without vomiting.   You have a vaginal discharge that is not getting lighter.   Get help right away if:   You have trouble breathing.   You have chest pain.   You have severe pain in your abdomen, and it does not get better with medicine.   You have leg pain or leg swelling.   You feel dizzy, or you faint.   Do not wait to see if the  symptoms will go away.   Do not drive yourself to the hospital.      These symptoms may be an emergency.    Get help right away. Call 911.   Summary   After the procedure, it is common to have cramps, or pain or discomfort at the incision site. You will be given pain medicine.   Follow instructions from your health care provider about how to take care of your incision. Check your incision area every day for signs of infection.   Take over-the-counter and prescription medicines only as told by your  health care provider.   Contact your health care provider if you have symptoms of infection or other symptoms that do not get better with treatment.   If you need to speak to someone after hours, please call the on call IR physician at [phone].  Tell them you are a patient of Dr. Marne Sings and that you had a Uterine Artery Embolization today and the issues you are having.  Thanks for visiting [facility]!

## 2024-01-01 ENCOUNTER — Ambulatory Visit
Admission: RE | Admit: 2024-01-01 | Discharge: 2024-01-01 | Disposition: A | Discharge status: Home / Self Care | Payer: 59 | Source: Ambulatory Visit | Attending: Interventional Radiology | Admitting: Interventional Radiology

## 2024-01-01 DIAGNOSIS — D259 Leiomyoma of uterus, unspecified: Secondary | ICD-10-CM

## 2024-01-01 HISTORY — PX: IR EMBO TUMOR ORGAN ISCHEMIA INFARCT INC GUIDE ROADMAPPING: IMG5449

## 2024-01-01 MED ORDER — LIDOCAINE HCL (PF) 1 % IJ SOLN
5.0000 mL | Freq: Once | INTRAMUSCULAR | Status: AC
  Administered 2024-01-01: 5 mL via INTRADERMAL

## 2024-01-01 MED ORDER — FENTANYL CITRATE (PF) 100 MCG/2ML IJ SOLN
INTRAMUSCULAR | Status: DC | PRN
  Administered 2024-01-01 (×2): 25 ug via INTRAVENOUS
  Administered 2024-01-01: 50 ug via INTRAVENOUS

## 2024-01-01 MED ORDER — CEFAZOLIN SODIUM-DEXTROSE 2-4 GM/100ML-% IV SOLN
2.0000 g | INTRAVENOUS | Status: AC
  Administered 2024-01-01: 2 g via INTRAVENOUS

## 2024-01-01 MED ORDER — IOPAMIDOL (ISOVUE-300) INJECTION 61%
130.0000 mL | Freq: Once | INTRAVENOUS | Status: AC | PRN
  Administered 2024-01-01: 110 mL via INTRA_ARTERIAL

## 2024-01-01 MED ORDER — FENTANYL CITRATE PF 50 MCG/ML IJ SOSY: 25.0000 ug | PREFILLED_SYRINGE | INTRAMUSCULAR | Status: DC | PRN

## 2024-01-01 MED ORDER — SODIUM CHLORIDE 0.9 % IV SOLN
INTRAVENOUS | Status: DC
  Administered 2024-01-01: 250 mL via INTRAVENOUS

## 2024-01-01 MED ORDER — ONDANSETRON HCL 4 MG/2ML IJ SOLN
8.0000 mg | Freq: Once | INTRAMUSCULAR | Status: AC
  Administered 2024-01-01: 8 mg via INTRAVENOUS

## 2024-01-01 MED ORDER — KETOROLAC TROMETHAMINE 30 MG/ML IJ SOLN
30.0000 mg | INTRAMUSCULAR | Status: AC
  Administered 2024-01-01: 30 mg via INTRAVENOUS

## 2024-01-01 MED ORDER — MIDAZOLAM HCL 2 MG/2ML IJ SOLN: 1.0000 mg | INTRAMUSCULAR | Status: DC | PRN

## 2024-01-01 MED ORDER — PANTOPRAZOLE SODIUM 40 MG IV SOLR
40.0000 mg | Freq: Once | INTRAVENOUS | Status: AC
  Administered 2024-01-01: 40 mg via INTRAVENOUS

## 2024-01-01 MED ORDER — ACETAMINOPHEN 10 MG/ML IV SOLN
1000.0000 mg | Freq: Once | INTRAVENOUS | Status: AC
  Administered 2024-01-01: 1000 mg via INTRAVENOUS

## 2024-01-01 MED ORDER — HYDROMORPHONE HCL 1 MG/ML IJ SOLN
0.5000 mg | Freq: Once | INTRAMUSCULAR | Status: AC
  Administered 2024-01-01: 1 mg via INTRAVENOUS

## 2024-01-01 MED ORDER — KETOROLAC TROMETHAMINE 30 MG/ML IJ SOLN
30.0000 mg | INTRAMUSCULAR | Status: AC
  Administered 2024-01-01: 30 mg via INTRAMUSCULAR

## 2024-01-01 MED ORDER — DEXAMETHASONE SODIUM PHOSPHATE 10 MG/ML IJ SOLN
10.0000 mg | Freq: Once | INTRAMUSCULAR | Status: AC
  Administered 2024-01-01: 10 mg via INTRAVENOUS

## 2024-01-01 MED ORDER — MIDAZOLAM HCL 2 MG/2ML IJ SOLN
INTRAMUSCULAR | Status: DC | PRN
Start: 2024-01-01 — End: 2024-01-02
  Administered 2024-01-01 (×2): .5 mg via INTRAVENOUS
  Administered 2024-01-01: 1 mg via INTRAVENOUS

## 2024-01-01 MED ORDER — PROMETHAZINE HCL 12.5 MG PO TABS
12.5000 mg | ORAL_TABLET | Freq: Once | ORAL | Status: AC
  Administered 2024-01-01: 25 mg via ORAL

## 2024-01-01 NOTE — Progress Notes (Signed)
Pt back in nursing recovery area. Pt still drowsy from procedure but will wake up when spoken to. Pt follows commands, talks in complete sentences and has no complaints at this time. Pt will remain in nursing station until discharge.  ?

## 2024-01-02 ENCOUNTER — Telehealth: Payer: Self-pay

## 2024-01-08 ENCOUNTER — Telehealth: Payer: Self-pay

## 2024-01-10 ENCOUNTER — Other Ambulatory Visit: Payer: Self-pay | Admitting: Interventional Radiology

## 2024-01-10 DIAGNOSIS — D259 Leiomyoma of uterus, unspecified: Secondary | ICD-10-CM

## 2024-01-15 ENCOUNTER — Ambulatory Visit
Admission: RE | Admit: 2024-01-15 | Discharge: 2024-01-15 | Disposition: A | Discharge status: Home / Self Care | Source: Ambulatory Visit | Attending: Interventional Radiology | Admitting: Interventional Radiology

## 2024-01-15 DIAGNOSIS — D259 Leiomyoma of uterus, unspecified: Secondary | ICD-10-CM

## 2024-01-15 HISTORY — PX: IR RADIOLOGIST EVAL & MGMT: IMG5224

## 2024-01-15 NOTE — Progress Notes (Addendum)
 This encounter was conducted via the [employer] providing interactive audio and visual communication.  The patient provided verbal consent to conduct a virtual appointment.  The patient was located at their primary residence during this encounter.   Chief Complaint: Patient was seen in consultation today for uterine fibroids at the request of Mylea Roarty K  Referring Physician(s): Glendoris Nodarse K  History of Present Illness: Monique Stone is a 38 y.o. female with a history of highly symptomatic uterine fibroids who underwent uterine artery embolization on 12/31/23.   She returns to clinic today for her 2 week post procedure evaluation.   Overall, she is doing well. Her cramping has largely resolved and remains only intermittent. She has some occasional tightness at her right femoral puncture site during walking.  No swelling or palpable lump.  Her appetite has returned and she is nearly back to full activities.  She intends to return to work on Monday May 5th.  I agree with her decision.   Past Medical History:  Diagnosis Date   Abnormal Pap smear    Anemia    Chlamydia trachomatis infection in pregnancy 08/25/08    Past Surgical History:  Procedure Laterality Date   CESAREAN SECTION  2008   CESAREAN SECTION  2010   INDUCED ABORTION  04/2014   IR EMBO TUMOR ORGAN ISCHEMIA INFARCT INC GUIDE ROADMAPPING  01/01/2024   IR RADIOLOGIST EVAL & MGMT  09/04/2023    Allergies: Patient has no known allergies.  Medications: Prior to Admission medications   Medication Sig Start Date End Date Taking? Authorizing Provider  metroNIDAZOLE  (METROGEL ) 0.75 % vaginal gel Place 1 Applicatorful vaginally at bedtime. Apply one applicatorful to vagina at bedtime for 5 days 12/19/23   Eldonna Suzen Octave, MD  megestrol  (MEGACE ) 40 MG tablet Take 1 tablet (40 mg total) by mouth 2 (two) times daily. 04/26/23   Izell Harari, MD  metroNIDAZOLE  (FLAGYL ) 500 MG tablet Take 1  tablet (500 mg total) by mouth 2 (two) times daily. 10/11/23   Eldonna Suzen Octave, MD  ondansetron  (ZOFRAN ) 8 MG tablet Take 1 tablet (8 mg total) by mouth every 8 (eight) hours as needed for nausea or vomiting. 12/31/23   Karalee Beat POUR, MD  promethazine  (PHENERGAN ) 12.5 MG tablet Take 1 tablet (12.5 mg total) by mouth every 4 (four) hours as needed for nausea or vomiting. 12/31/23   Karalee Beat POUR, MD  fluticasone  (FLONASE ) 50 MCG/ACT nasal spray Place 2 sprays into both nostrils daily. Patient not taking: Reported on 03/13/2019 03/08/18 06/05/19  Babara Greig GAILS, PA-C     Family History  Problem Relation Age of Onset   Diabetes Maternal Grandmother    Hypothyroidism Mother     Social History   Socioeconomic History   Marital status: Single    Spouse name: Not on file   Number of children: Not on file   Years of education: Not on file   Highest education level: Not on file  Occupational History   Not on file  Tobacco Use   Smoking status: Never   Smokeless tobacco: Never  Vaping Use   Vaping status: Never Used  Substance and Sexual Activity   Alcohol use: Yes    Alcohol/week: 0.0 standard drinks of alcohol    Comment: rarely   Drug use: No   Sexual activity: Yes    Partners: Male  Other Topics Concern   Not on file  Social History Narrative   Not on file   Social Drivers of  Health   Financial Resource Strain: Not on file  Food Insecurity: Not on file  Transportation Needs: Not on file  Physical Activity: Not on file  Stress: Not on file  Social Connections: Unknown (01/31/2022)   Received from [facility], [facility]   Social Network    Social Network: Not on file   Review of Systems: A 12 point ROS discussed and pertinent positives are indicated in the HPI above.  All other systems are negative.  Review of Systems  Vital Signs: There were no vitals taken for this visit.    Physical Exam Constitutional:      General: She is not in acute  distress.    Appearance: Normal appearance.  HENT:     Head: Normocephalic and atraumatic.  Eyes:     General: No scleral icterus. Cardiovascular:     Rate and Rhythm: Normal rate.  Pulmonary:     Effort: Pulmonary effort is normal.  Abdominal:     General: There is no distension.     Palpations: Abdomen is soft.     Tenderness: There is no abdominal tenderness.  Skin:    General: Skin is warm and dry.  Neurological:     Mental Status: She is alert and oriented to person, place, and time.  Psychiatric:        Mood and Affect: Mood normal.        Behavior: Behavior normal.       Imaging: IR EMBO TUMOR ORGAN ISCHEMIA INFARCT INC GUIDE ROADMAPPING Result Date: 01/01/2024 INDICATION: Symptomatic uterine fibroids. EXAM: IR EMBO TUMOR ORGAN ISCHEMIA INFARCT INC GUIDE ROADMAPPING 1.) Bilateral COLOMBIA MEDICATIONS: PRE PROCEDURE: 1000 mg Tylenol  IV, 10 mg Decadron  IV, 2 g Ancef  IV, Protonix  40 mg IV, Zofran  8 mg IV administered by radiology nursing under my supervision. INTRA PROCEDURE: Toradol  30 mg IV, Toradol  30 mg IM administered by radiology nursing under my supervision. POST PROCEDURE: Dilaudid  1 mg IV and Phenergan  25 mg p.o. administered by radiology nursing under my supervision. ANESTHESIA/SEDATION: Moderate (conscious) sedation was employed during this procedure. A total of Versed  2 mg and Fentanyl  100 mcg was administered intravenously by radiology nursing under my supervision. Moderate Sedation Time: 51 minutes. The patient's level of consciousness and vital signs were monitored continuously by radiology nursing throughout the procedure under my direct supervision. CONTRAST:  110mL ISOVUE -300 IOPAMIDOL  (ISOVUE -300) INJECTION 61% FLUOROSCOPY: Radiation Exposure Index (as provided by the fluoroscopic device): 167.9 mGy Kerma COMPLICATIONS: None immediate. PROCEDURE: Informed consent was obtained from the patient following explanation of the procedure, risks, benefits and alternatives. The  patient understands, agrees and consents for the procedure. All questions were addressed. A time out was performed prior to the initiation of the procedure. Maximal barrier sterile technique utilized including caps, mask, sterile gowns, sterile gloves, large sterile drape, hand hygiene, and Betadine prep. The right common femoral artery was interrogated with ultrasound and found to be widely patent. An image was obtained and stored for the medical record. Local anesthesia was attained by infiltration with 1% lidocaine . A small dermatotomy was made. Under real-time sonographic guidance, the vessel was punctured with a 21 gauge micropuncture needle. Using standard technique, the initial micro needle was exchanged over a 0.018 micro wire for a transitional 4 [place] micro sheath. The micro sheath was then exchanged over a 0.035 wire for a 5 French vascular sheath. A C2 cobra catheter was advanced over a Bentson wire up in over the aortic bifurcation and into the left internal iliac artery.  A left internal iliac arteriogram was performed. The origin of the left uterine artery was identified. A renegade hiFlo microcatheter was advanced over a Fathom 16 wire into the horizontal segment of the left uterine artery. Arteriography was performed confirming opacification of the intramural uterine branches. No evidence of cervical vaginal branch. Particle embolization was performed utilizing 2 vials of 500 micron Embozene and 2/3 of a vial of 700 micron Embozene. Postembolization arteriography confirms near stasis. The microcatheter was removed. The C2 cobra catheter was formed into a Waltman's loop and used to select the ipsilateral right internal iliac artery. Contrast injection was performed with digital subtraction angiography and the origin of the right uterine artery was identified. The high-flow microcatheter was advanced over the Fathom 16 wire into The horizontal segment of the right uterine artery. Arteriography was  performed confirming catheter placement and absence of cervicalvaginal branches distal to the catheter tip. Particle embolization was performed utilizing 1 vial of 500 micron Embozene. Follow-up contrast injection confirms near stasis. The Waltman's loop was un formed and the catheter removed over a Bentson wire. Hemostasis was then attained with the assistance of a Celt arterial closure device (ACD). IMPRESSION: 1. Technically successful bilateral uterine artery embolization. Electronically Signed   By: Wilkie Lent M.D.   On: 01/01/2024 10:23    Labs:  CBC: Recent Labs    04/07/23 1415  WBC 4.7  HGB 12.2  HCT 36.4  PLT 319    COAGS: No results for input(s): INR, APTT in the last 8760 hours.  BMP: Recent Labs    04/07/23 1415  NA 137  K 3.3*  CL 106  CO2 26  GLUCOSE 89  BUN 12  CALCIUM 8.7*  CREATININE 0.76  GFRNONAA >60    LIVER FUNCTION TESTS: No results for input(s): BILITOT, AST, ALT, ALKPHOS, PROT, ALBUMIN in the last 8760 hours.  TUMOR MARKERS: No results for input(s): AFPTM, CEA, CA199, CHROMGRNA in the last 8760 hours.  Assessment and Plan:  Very pleasant 38 year old female doing very well 2 weeks status post uterine artery embolization.  She is almost completely and fully recovered from her procedure.  She is released to return to work on Monday, May 5th.   1.)  next follow-up visit in 3 months.      Electronically Signed: CONCHITA TRUXILLO 01/15/2024, 8:34 AM   I spent a total of  15 Minutes in face to face in clinical consultation, greater than 50% of which was counseling/coordinating care for uterine fibroids status post [place].

## 2024-04-30 ENCOUNTER — Other Ambulatory Visit: Payer: Self-pay | Admitting: Interventional Radiology

## 2024-04-30 DIAGNOSIS — D259 Leiomyoma of uterus, unspecified: Secondary | ICD-10-CM

## 2024-05-22 ENCOUNTER — Encounter: Payer: Self-pay | Admitting: Obstetrics & Gynecology

## 2024-05-22 ENCOUNTER — Ambulatory Visit (INDEPENDENT_AMBULATORY_CARE_PROVIDER_SITE_OTHER): Admitting: Obstetrics & Gynecology

## 2024-05-22 VITALS — BP 125/85 | HR 78 | Wt 140.0 lb

## 2024-05-22 DIAGNOSIS — R768 Other specified abnormal immunological findings in serum: Secondary | ICD-10-CM

## 2024-05-22 NOTE — Progress Notes (Signed)
   GYNECOLOGY OFFICE VISIT NOTE  History:  Monique Stone is a 38 y.o. [id number] here today for discussion of positive HSV1 antibody test; this was done at her PCP's office as part of routine healthcare check. She had no symptoms.  She denies ever having any lesions on her genitals, and unsure if she had oral cold sores in the past.  She denies any abnormal vaginal discharge, bleeding, pelvic pain or other concerns.  Past Medical History:  Diagnosis Date   Abnormal Pap smear    Anemia    Chlamydia trachomatis infection in pregnancy 08/25/08    Past Surgical History:  Procedure Laterality Date   CESAREAN SECTION  2008   CESAREAN SECTION  2010   INDUCED ABORTION  04/2014   IR EMBO TUMOR ORGAN ISCHEMIA INFARCT INC GUIDE ROADMAPPING  01/01/2024   IR RADIOLOGIST EVAL & MGMT  09/04/2023   IR RADIOLOGIST EVAL & MGMT  01/15/2024    The following portions of the patient's history were reviewed and updated as appropriate: allergies, current medications, past family history, past medical history, past social history, past surgical history and problem list.   Health Maintenance:  Normal pap and positive HRHPV on 08/20/2023.  Review of Systems:  Pertinent items noted in HPI and remainder of comprehensive ROS otherwise negative.  Physical Exam:  BP 125/85   Pulse 78   Wt 140 lb (63.5 kg)   BMI 28.28 kg/m  CONSTITUTIONAL: Well-developed, well-nourished female in no acute distress.  HEENT:  Normocephalic, atraumatic. External right and left ear normal. No scleral icterus.  NECK: Normal range of motion, supple, no masses noted on observation SKIN: No rash noted. Not diaphoretic. No erythema. No pallor. MUSCULOSKELETAL: Normal range of motion. No edema noted. NEUROLOGIC: Alert and oriented to person, place, and time. Normal muscle tone coordination. No cranial nerve deficit noted on observation. PSYCHIATRIC: Normal mood and affect. Normal behavior. Normal judgment and thought  content. CARDIOVASCULAR: Normal heart rate noted RESPIRATORY: Effort and breath sounds normal, no problems with respiration noted ABDOMEN: No masses or other overt distention noted on observation. No tenderness.   PELVIC: Deferred   Assessment and Plan:     1. 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 If no history of genital lesions, not concerned about this. She was reassured.  Routine preventative health maintenance measures emphasized. Please refer to After Visit Summary for other counseling recommendations.   Return for after 08/19/2024 for pap smear.    I spent 25 minutes dedicated to the care of this patient including pre-visit review of records, face to face time with the patient discussing her conditions and treatments, post visit ordering of medications and appropriate tests or procedures, coordinating care and documenting this visit encounter.    GLORIS HUGGER, MD, FACOG Obstetrician & Gynecologist, [facility] for [employer], [facility]

## 2024-06-17 ENCOUNTER — Ambulatory Visit
Admission: RE | Admit: 2024-06-17 | Discharge: 2024-06-17 | Disposition: A | Discharge status: Home / Self Care | Source: Ambulatory Visit | Attending: Interventional Radiology | Admitting: Interventional Radiology

## 2024-06-17 DIAGNOSIS — D259 Leiomyoma of uterus, unspecified: Secondary | ICD-10-CM

## 2024-06-17 HISTORY — PX: IR RADIOLOGIST EVAL & MGMT: IMG5224

## 2024-06-17 NOTE — Progress Notes (Signed)
 Chief Complaint: Patient was seen in consultation today for uterine fibroids at the request of Ellisyn Icenhower K  Referring Physician(s): Adelise Buswell K  History of Present Illness: Monique Stone is a 38 y.o. female  with a history of highly symptomatic uterine fibroids who underwent uterine artery embolization on 12/31/23.    We spoke over MyChart video conference today for her 6 month follow-up.  She is very pleased and states that she feels great.  Her cycles remain irregular but her heavy bleeding has completely stopped.  She also reports less bloating and pelvic pressure.   Past Medical History:  Diagnosis Date   Abnormal Pap smear    Anemia    Chlamydia trachomatis infection in pregnancy 08/25/08    Past Surgical History:  Procedure Laterality Date   CESAREAN SECTION  2008   CESAREAN SECTION  2010   INDUCED ABORTION  04/2014   IR EMBO TUMOR ORGAN ISCHEMIA INFARCT INC GUIDE ROADMAPPING  01/01/2024   IR RADIOLOGIST EVAL & MGMT  09/04/2023   IR RADIOLOGIST EVAL & MGMT  01/15/2024   IR RADIOLOGIST EVAL & MGMT  06/17/2024    Allergies: Patient has no known allergies.  Medications: Prior to Admission medications   Medication Sig Start Date End Date Taking? Authorizing Provider  megestrol  (MEGACE ) 40 MG tablet Take 1 tablet (40 mg total) by mouth 2 (two) times daily. 04/26/23   Izell Harari, MD  metroNIDAZOLE  (FLAGYL ) 500 MG tablet Take 1 tablet (500 mg total) by mouth 2 (two) times daily. 10/11/23   Eldonna Suzen Octave, MD  metroNIDAZOLE  (METROGEL ) 0.75 % vaginal gel Place 1 Applicatorful vaginally at bedtime. Apply one applicatorful to vagina at bedtime for 5 days 12/19/23   Eldonna Suzen Octave, MD  ondansetron  (ZOFRAN ) 8 MG tablet Take 1 tablet (8 mg total) by mouth every 8 (eight) hours as needed for nausea or vomiting. 12/31/23   Karalee Beat POUR, MD  promethazine  (PHENERGAN ) 12.5 MG tablet Take 1 tablet (12.5 mg total) by mouth every 4 (four) hours as  needed for nausea or vomiting. 12/31/23   Karalee Beat POUR, MD  fluticasone  (FLONASE ) 50 MCG/ACT nasal spray Place 2 sprays into both nostrils daily. Patient not taking: Reported on 03/13/2019 03/08/18 06/05/19  Babara Greig GAILS, PA-C     Family History  Problem Relation Age of Onset   Diabetes Maternal Grandmother    Hypothyroidism Mother     Social History   Socioeconomic History   Marital status: Single    Spouse name: Not on file   Number of children: Not on file   Years of education: Not on file   Highest education level: Not on file  Occupational History   Not on file  Tobacco Use   Smoking status: Never   Smokeless tobacco: Never  Vaping Use   Vaping status: Never Used  Substance and Sexual Activity   Alcohol use: Yes    Alcohol/week: 0.0 standard drinks of alcohol    Comment: rarely   Drug use: No   Sexual activity: Yes    Partners: Male  Other Topics Concern   Not on file  Social History Narrative   Not on file   Social Drivers of Health   Financial Resource Strain: Not on file  Food Insecurity: Not on file  Transportation Needs: Not on file  Physical Activity: Not on file  Stress: Not on file  Social Connections: Unknown (01/31/2022)   Received from [facility]   Social Network    Social  Network: Not on file    Review of Systems: A 12 point ROS discussed and pertinent positives are indicated in the HPI above.  All other systems are negative.  Review of Systems  Vital Signs: There were no vitals taken for this visit.    Physical Exam Constitutional:      General: She is not in acute distress.    Appearance: Normal appearance. She is normal weight.  HENT:     Head: Normocephalic and atraumatic.  Eyes:     General: No scleral icterus. Pulmonary:     Effort: Pulmonary effort is normal.  Neurological:     Mental Status: She is alert and oriented to person, place, and time.  Psychiatric:        Mood and Affect: Mood normal.        Behavior:  Behavior normal.       Imaging: IR Radiologist Eval & Mgmt Result Date: 06/17/2024 EXAM: NEW PATIENT OFFICE VISIT CHIEF COMPLAINT: SEE NOTE IN EPIC HISTORY OF PRESENT ILLNESS: SEE NOTE IN EPIC REVIEW OF SYSTEMS: SEE NOTE IN EPIC PHYSICAL EXAMINATION: SEE NOTE IN EPIC ASSESSMENT AND PLAN: SEE NOTE IN EPIC Electronically Signed   By: Wilkie Lent M.D.   On: 06/17/2024 12:18    Labs:  CBC: No results for input(s): WBC, HGB, HCT, PLT in the last 8760 hours.  COAGS: No results for input(s): INR, APTT in the last 8760 hours.  BMP: No results for input(s): NA, K, CL, CO2, GLUCOSE, BUN, CALCIUM, CREATININE, GFRNONAA, GFRAA in the last 8760 hours.  Invalid input(s): CMP  LIVER FUNCTION TESTS: No results for input(s): BILITOT, AST, ALT, ALKPHOS, PROT, ALBUMIN in the last 8760 hours.  TUMOR MARKERS: No results for input(s): AFPTM, CEA, CA199, CHROMGRNA in the last 8760 hours.  Assessment and Plan:  Doing extremely well 5 months post uterine artery embolization for symptomatic uterine fibroids.  She is very happy with her result and has no more heavy bleeding and decreased bulk symptoms.    - No further scheduled follow-up    Electronically Signed: MADINE SARR 06/17/2024, 1:46 PM   This encounter was conducted via the [employer] providing interactive audio and visual communication.  The patient provided verbal consent to conduct a virtual appointment.  The patient was located at their primary residence during this encounter.   I spent a total of  15 Minutes in face to face in clinical consultation, greater than 50% of which was counseling/coordinating care for uterine fibroids

## 2024-06-17 NOTE — Addendum Note (Signed)
 Encounter addended by: Karalee Wilkie POUR, MD on: 06/17/2024 2:07 PM  Actions taken: Clinical Note Signed

## 2024-08-19 ENCOUNTER — Telehealth: Payer: Self-pay | Admitting: Emergency Medicine

## 2024-08-19 ENCOUNTER — Ambulatory Visit: Admission: EM | Admit: 2024-08-19 | Discharge: 2024-08-19 | Disposition: A | Discharge status: Home / Self Care

## 2024-08-19 ENCOUNTER — Encounter: Payer: Self-pay | Admitting: Emergency Medicine

## 2024-08-19 DIAGNOSIS — J0100 Acute maxillary sinusitis, unspecified: Secondary | ICD-10-CM

## 2024-08-19 MED ORDER — PROMETHAZINE-DM 6.25-15 MG/5ML PO SYRP: 5.0000 mL | ORAL_SOLUTION | Freq: Every evening | ORAL | 0 refills | Status: AC | PRN

## 2024-08-19 MED ORDER — AMOXICILLIN-POT CLAVULANATE 400-57 MG/5ML PO SUSR: 875.0000 mg | Freq: Two times a day (BID) | ORAL | 0 refills | Status: AC

## 2024-08-19 MED ORDER — AMOXICILLIN-POT CLAVULANATE 875-125 MG PO TABS: 1.0000 | ORAL_TABLET | Freq: Two times a day (BID) | ORAL | 0 refills | Status: DC

## 2024-08-19 MED ORDER — BENZONATATE 100 MG PO CAPS: 100.0000 mg | ORAL_CAPSULE | Freq: Three times a day (TID) | ORAL | 0 refills | Status: AC

## 2024-08-19 NOTE — Discharge Instructions (Signed)
 Today you are being treated for sinus infection  Begin Augmentin twice daily for 7 days for treatment of bacteria  You may use Tessalon pill every 8 hours as needed for cough and may use cough syrup at bedtime to allow for rest    You can take Tylenol  and/or Ibuprofen as needed for fever reduction and pain relief.   For cough: honey 1/2 to 1 teaspoon (you can dilute the honey in water or another fluid).  You can also use guaifenesin and dextromethorphan for cough. You can use a humidifier for chest congestion and cough.  If you don't have a humidifier, you can sit in the bathroom with the hot shower running.      For sore throat: try warm salt water gargles, cepacol lozenges, throat spray, warm tea or water with lemon/honey, popsicles or ice, or OTC cold relief medicine for throat discomfort.   For congestion: take a daily anti-histamine like Zyrtec, Claritin, and a oral decongestant, such as pseudoephedrine.  You can also use Flonase  1-2 sprays in each nostril daily.   It is important to stay hydrated: drink plenty of fluids (water, gatorade/powerade/pedialyte, juices, or teas) to keep your throat moisturized and help further relieve irritation/discomfort.

## 2024-08-19 NOTE — ED Triage Notes (Signed)
 Patient reports nasal congestion, body aches, chest congestion, headache, cough with brown mucus x 6 days. Patient has been taking There-flu and alka- Seltzer cold plus and Advil with mild relief. Rates body aches 9/10 and head ache 9/10.

## 2024-08-19 NOTE — ED Provider Notes (Signed)
 Monique Stone    CSN: [id number] Arrival date & time: 08/19/24  1544      History   Chief Complaint Chief Complaint  Patient presents with   Cough   Generalized Body Aches   Headache   Nasal Congestion    HPI Monique Stone is a 38 y.o. female.   Patient presents for evaluation of nasal congestion, sore throat, productive cough with brown mucus, shortness of breath with coughing and intermittent headaches present for 6 days.  Endorsing congestion and headache worsening, has begun to experience chest discomfort with coughing.  Decreased appetite but tolerable to some food and liquids.  No known sick contacts prior.  Has attempted use of TheraFlu, Alka-Seltzer and Advil.     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 Authorizing Provider  Vitamin D , Ergocalciferol , (DRISDOL) 1.25 MG (50000 UNIT) CAPS capsule Take 50,000 Units by mouth once a week. 06/15/24  Yes [provider]  megestrol  (MEGACE ) 40 MG tablet Take 1 tablet (40 mg total) by mouth 2 (two)  times daily. 04/26/23   Izell Harari, MD  metroNIDAZOLE  (FLAGYL ) 500 MG tablet Take 1 tablet (500 mg total) by mouth 2 (two) times daily. 10/11/23   Eldonna Suzen Octave, MD  metroNIDAZOLE  (METROGEL ) 0.75 % vaginal gel Place 1 Applicatorful vaginally at bedtime. Apply one applicatorful to vagina at bedtime for 5 days 12/19/23   Eldonna Suzen Octave, MD  ondansetron  (ZOFRAN ) 8 MG tablet Take 1 tablet (8 mg total) by mouth every 8 (eight) hours as needed for nausea or vomiting. 12/31/23   Karalee Beat POUR, MD  promethazine  (PHENERGAN ) 12.5 MG tablet Take 1 tablet (12.5 mg total) by mouth every 4 (four) hours as needed for nausea or vomiting. 12/31/23   Karalee Beat POUR, MD  fluticasone  (FLONASE ) 50 MCG/ACT nasal spray Place 2 sprays into both nostrils daily. Patient not taking: Reported on 03/13/2019 03/08/18 06/05/19  Babara Greig LULLA Stone    Family History Family History  Problem Relation Age of Onset   Diabetes Maternal Grandmother    Hypothyroidism Mother     Social History Social History   Tobacco Use   Smoking status: Never   Smokeless tobacco: Never  Vaping Use  Vaping status: Never Used  Substance Use Topics   Alcohol use: Yes    Alcohol/week: 0.0 standard drinks of alcohol    Comment: rarely   Drug use: No     Allergies   Patient has no known allergies.   Review of Systems Review of Systems  Constitutional: Negative.   HENT:  Positive for congestion, sore throat and voice change. Negative for dental problem, drooling, ear discharge, ear pain, facial swelling, hearing loss, mouth sores, nosebleeds, postnasal drip, rhinorrhea, sinus pressure, sinus pain, sneezing, tinnitus and trouble swallowing.   Respiratory:  Positive for cough and shortness of breath. Negative for apnea, choking, chest tightness, wheezing and stridor.   Neurological:  Positive for headaches. Negative for dizziness, tremors, seizures, syncope, facial asymmetry, speech difficulty, weakness,  light-headedness and numbness.     Physical Exam Triage Vital Signs ED Triage Vitals  Encounter Vitals Group     BP 08/19/24 1617 118/84     Girls Systolic BP Percentile --      Girls Diastolic BP Percentile --      Boys Systolic BP Percentile --      Boys Diastolic BP Percentile --      Pulse Rate 08/19/24 1617 89     Resp 08/19/24 1617 20     Temp 08/19/24 1617 98.8 F (37.1 C)     Temp Source 08/19/24 1617 Oral     SpO2 08/19/24 1617 96 %     Weight --      Height --      Head Circumference --      Peak Flow --      Pain Score 08/19/24 1621 9     Pain Loc --      Pain Education --      Exclude from Growth Chart --    No data found.  Updated Vital Signs BP 118/84 (BP Location: Left Arm)   Pulse 89   Temp 98.8 F (37.1 C) (Oral)   Resp 20   LMP 07/24/2024 (Exact Date)   SpO2 96%   Visual Acuity Right Eye Distance:   Left Eye Distance:   Bilateral Distance:    Right Eye Near:   Left Eye Near:    Bilateral Near:     Physical Exam Constitutional:      Appearance: Normal appearance.  HENT:     Right Ear: Tympanic membrane, ear canal and external ear normal.     Left Ear: Tympanic membrane, ear canal and external ear normal.     Nose: Congestion present.     Left Sinus: Maxillary sinus tenderness present.     Mouth/Throat:     Pharynx: No oropharyngeal exudate or posterior oropharyngeal erythema.  Cardiovascular:     Rate and Rhythm: Normal rate and regular rhythm.     Pulses: Normal pulses.     Heart sounds: Normal heart sounds.  Pulmonary:     Effort: Pulmonary effort is normal.     Breath sounds: Normal breath sounds.  Neurological:     Mental Status: She is alert and oriented to person, place, and time. Mental status is at baseline.      UC Treatments / Results  Labs (all labs ordered are listed, but only abnormal results are displayed) Labs Reviewed - No data to display  EKG   Radiology No results found.  Procedures Procedures  (including critical care time)  Medications Ordered in UC Medications - No data to display  Initial Impression / Assessment and Plan /  UC Course  I have reviewed the triage vital signs and the nursing notes.  Pertinent labs & imaging results that were available during my care of the patient were reviewed by me and considered in my medical decision making (see chart for details).  Acute nonrecurrent maxillary sinusitis  Patient is in no signs of distress nor toxic appearing.  Vital signs are stable.  Low suspicion for pneumonia, pneumothorax or bronchitis and therefore will defer imaging.  Viral testing deferred due to timeline of illness.  Symptomology and presentation consistent with a sinus infection, prescribed Augmentin and additionally prescribed Tessalon and Promethazine  DM for management of cough, declined steroids. May use additional over-the-counter medications as needed for supportive care.  May follow-up with urgent care as needed if symptoms persist or worsen.   Final Clinical Impressions(s) / UC Diagnoses   Final diagnoses:  None   Discharge Instructions   None    ED Prescriptions   None    PDMP not reviewed this encounter.   Teresa Shelba SAUNDERS, NP 08/19/24 [phone]

## 2024-08-19 NOTE — Telephone Encounter (Signed)
 Patient notified clinic that her pills are too big for her to take, liquid medicine sent to pharmacy

## 2024-08-20 ENCOUNTER — Telehealth: Payer: Self-pay

## 2024-08-20 NOTE — Telephone Encounter (Signed)
 Work excuse sent to patient's MyChart per patient's request.

## 2024-09-01 ENCOUNTER — Ambulatory Visit: Admitting: Obstetrics and Gynecology

## 2024-09-25 ENCOUNTER — Encounter: Payer: Self-pay | Admitting: Obstetrics and Gynecology

## 2024-09-25 ENCOUNTER — Ambulatory Visit: Admitting: Obstetrics and Gynecology

## 2024-09-25 ENCOUNTER — Other Ambulatory Visit (HOSPITAL_COMMUNITY)
Admission: RE | Admit: 2024-09-25 | Discharge: 2024-09-25 | Disposition: A | Source: Ambulatory Visit | Attending: Obstetrics and Gynecology | Admitting: Obstetrics and Gynecology

## 2024-09-25 VITALS — BP 123/82 | HR 74 | Wt 144.0 lb

## 2024-09-25 DIAGNOSIS — Z202 Contact with and (suspected) exposure to infections with a predominantly sexual mode of transmission: Secondary | ICD-10-CM | POA: Diagnosis present

## 2024-09-25 DIAGNOSIS — R87619 Unspecified abnormal cytological findings in specimens from cervix uteri: Secondary | ICD-10-CM | POA: Diagnosis present

## 2024-09-25 DIAGNOSIS — Z9889 Other specified postprocedural states: Secondary | ICD-10-CM | POA: Diagnosis not present

## 2024-09-25 DIAGNOSIS — T8332XD Displacement of intrauterine contraceptive device, subsequent encounter: Secondary | ICD-10-CM

## 2024-09-25 DIAGNOSIS — D219 Benign neoplasm of connective and other soft tissue, unspecified: Secondary | ICD-10-CM | POA: Diagnosis not present

## 2024-09-25 NOTE — Progress Notes (Unsigned)
 Patient presents for Annual.  LMP: No LMP recorded.  Last pap: Date: 08/20/23 Contraception: None Mammogram: Not yet indicated STD Screening: Accepts Flu Vaccine : Declines   CC: Menstrual cycles still irregular, no heavy bleeding

## 2024-09-29 DIAGNOSIS — Z9889 Other specified postprocedural states: Secondary | ICD-10-CM | POA: Insufficient documentation

## 2024-09-29 LAB — CERVICOVAGINAL ANCILLARY ONLY
Bacterial Vaginitis (gardnerella): POSITIVE — AB
Candida Glabrata: NEGATIVE
Candida Vaginitis: NEGATIVE
Chlamydia: NEGATIVE
Comment: NEGATIVE
Comment: NEGATIVE
Comment: NEGATIVE
Comment: NEGATIVE
Comment: NEGATIVE
Comment: NORMAL
Neisseria Gonorrhea: NEGATIVE
Trichomonas: NEGATIVE

## 2024-09-29 MED ORDER — METRONIDAZOLE 500 MG PO TABS: 500.0000 mg | ORAL_TABLET | Freq: Two times a day (BID) | ORAL | 0 refills | Status: AC

## 2024-09-29 NOTE — Progress Notes (Signed)
 Obstetrics and Gynecology Annual Patient Evaluation  Appointment Date: 09/25/2024  OBGYN Clinic: [facility]   Primary Care Provider: Patient, No Pcp Per  Chief Complaint:  Chief Complaint  Patient presents with   Annual Exam    History of Present Illness: Monique Stone is a 39 y.o. African-American [id number] (No LMP recorded.), seen for the above chief complaint. Her past medical history is significant for c/s x 2, fibroids, April 2025 IR UAE, IUD currently in place  Patient last seen by GYN by Dr. 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 Prior to this, she saw IR in April 2025 for a 2wk post procedure check after mid April bilateral UAE and patient doing well. Before this, she was seen 08/2023 by Dr. Eldonna for an annual exam and endorsed intermittent spotting. She had an Mirena  a few days prior but it had not been read yet. At that annual, no strings seen at her pap smear and IUD presumed to have fallen out.  She saw Dr. Herchel on 04/24/23 for ED follow up, IUD removal and no strings seen at that visit. She had been seen in the ED two weeks for AUB before and an u/s showed large fibroid uterus and no mention of IUD; Dr. DELENA states she sent a message to radiology to see if IUD could still be in place even though on her review of the images she didn't see one in place. The visit concluded with patient stated she was interested in surgery with me and she was referred to me.  I saw her two days later for a virtual visit. I told her that she needed an x-ray to look for the IUD and to make sure that it was indeed expelled. I also d/w her re: fibroid management options and she was to consider the options and let me know next steps, and I d/w her that she would need an embx once she decided on next steps. She had an xray abdomen and pelvis a month later for lost IUD and the impression was negative. She sent me a message a six weeks later and  stated she was interested in UAE so I referred her to IR. She saw IR mid December 2024 with MRI read on 12/7 noting an IUD rotated to the left in the endometrial cavity, 13.5 x 8.5 x 11.4cm fibroid uterus with a 9-10cm submucosal fibroid noted in addition to multiple smaller ones.   Review of Systems: Pertinent items are noted in HPI.   Past Medical History:  Past Medical History:  Diagnosis Date   Abnormal Pap smear    Anemia    Chlamydia trachomatis infection in pregnancy 08/25/08   Past Surgical History:  Past Surgical History:  Procedure Laterality Date   CESAREAN SECTION  2008   CESAREAN SECTION  2010   INDUCED ABORTION  04/2014   IR EMBO TUMOR ORGAN ISCHEMIA INFARCT INC GUIDE ROADMAPPING  01/01/2024   IR RADIOLOGIST EVAL & MGMT  09/04/2023   IR RADIOLOGIST EVAL & MGMT  01/15/2024   IR RADIOLOGIST EVAL & MGMT  06/17/2024   Past Obstetrical History:  OB History  Gravida Para Term Preterm AB Living  3 2 2  0 1 2  SAB IAB Ectopic Multiple Live Births  0 1 0 0 2    # Outcome Date GA Lbr Len/2nd Weight Sex Type Anes PTL Lv  3 IAB 04/26/14  2 Term 2010    M CS-Classical   LIV  1 Term 2008    F CS-Classical   LIV   Past Gynecological History: As per HPI.  Social History:  Social History   Socioeconomic History   Marital status: Single    Spouse name: Not on file   Number of children: Not on file   Years of education: Not on file   Highest education level: Not on file  Occupational History   Not on file  Tobacco Use   Smoking status: Never   Smokeless tobacco: Never  Vaping Use   Vaping status: Never Used  Substance and Sexual Activity   Alcohol use: Yes    Alcohol/week: 0.0 standard drinks of alcohol    Comment: rarely   Drug use: No   Sexual activity: Yes    Partners: Male    Birth control/protection: None  Other Topics Concern   Not on file  Social History Narrative   Not on file   Social Drivers of Health   Tobacco Use: Low Risk (09/25/2024)    Patient History    Smoking Tobacco Use: Never    Smokeless Tobacco Use: Never    Passive Exposure: Not on file  Financial Resource Strain: Not on file  Food Insecurity: Not on file  Transportation Needs: Not on file  Physical Activity: Not on file  Stress: Not on file  Social Connections: Unknown (01/31/2022)   Received from [facility]   Social Network    Social Network: Not on file  Intimate Partner Violence: Unknown (12/22/2021)   Received from [facility]   HITS    Physically Hurt: Not on file    Insult or Talk Down To: Not on file    Threaten Physical Harm: Not on file    Scream or Curse: Not on file  Depression (PHQ2-9): Low Risk (09/25/2024)   Depression (PHQ2-9)    PHQ-2 Score: 0  Alcohol Screen: Not on file  Housing: Not on file  Utilities: Not on file  Health Literacy: Not on file   Family History:  Family History  Problem Relation Age of Onset   Diabetes Maternal Grandmother    Hypothyroidism Mother    Medications Sevin Farone had no medications administered during this visit. Current Outpatient Medications  Medication Sig Dispense Refill   benzonatate  (TESSALON ) 100 MG capsule Take 1 capsule (100 mg total) by mouth every 8 (eight) hours. (Patient not taking: Reported on 09/25/2024) 21 capsule 0   promethazine -dextromethorphan (PROMETHAZINE -DM) 6.25-15 MG/5ML syrup Take 5 mLs by mouth at bedtime as needed. (Patient not taking: Reported on 09/25/2024) 118 mL 0   Vitamin D , Ergocalciferol , (DRISDOL) 1.25 MG (50000 UNIT) CAPS capsule Take 50,000 Units by mouth once a week. (Patient not taking: Reported on 09/25/2024)     No current facility-administered medications for this visit.   Allergies Patient has no known allergies.  Physical Exam:  BP 123/82   Pulse 74   Wt 144 lb (65.3 kg)   BMI 29.08 kg/m  Body mass index is 29.08 kg/m. General appearance: Well nourished, well developed female in no acute distress.  Respiratory: Normal respiratory effort Abdomen:  soft, nttp, nd (see below) Neuro/Psych:  Normal mood and affect.  Skin:  Warm and dry.  Pelvic exam: VULVA: normal appearing vulva with no masses, tenderness or lesions, VAGINA: normal appearing vagina with normal color and discharge, no lesions, CERVIX: normal appearing cervix without discharge or lesions, NO IUD STRINGS SEEN, UTERUS: enlarged to 16  week's size, mobile, nttp.  ADNEXA: normal adnexa in size, nontender and no masses, exam chaperoned by CMA.  Laboratory: none  Radiology: as per HPI.   Assessment: patient stable  Plan:  1. Abnormal cervical Papanicolaou smear, unspecified abnormal pap finding (Primary) 08/2023 NILM/HPV+, 16/18/45 negative - Cytology - PAP  2. STD exposure - Cervicovaginal ancillary only  3. Intrauterine contraceptive device threads lost, subsequent encounter I reviewed the x-ray images from 05/2023 and I seen an IUD in the images. I apologized to her b/c I had just read the impression when the x-ray was done and I did not look at the images. I also shared with her that an IUD was noted at her pre UAE MRI, which she was not aware of.   I told her that the AUB that she had in July for a few days and October for a few days, but I would say that this acceptable s/p a UAE and the lack of periods could be from the Mirena , as well.   I told her that given the size of the submucosal fibroid that hysteroscopic myomectomy will likely need more than one procedure in the OR. I also d/w her re: hysterectomy, which I would do as an open procedure but may be able to be done l/s with MIGS; she states that she may want to get pregnant in the future. I told her that pregnancy after a UAE isn't recommended, or waiting 2 years prior to pregnancy, at a minimum. I also said that I would recommend fibroid removal.  Patient is reasonably shocked that she still has an IUD in place so I told her let's f/u in a week or two and talk more. Pt also need repeat imaging to see what her uterus  is like now  4. Fibroid  5. Status post embolization of uterine artery  Return in about 10 days (around 10/05/2024) for 1-2wks, in person, with dr izell.  Future Appointments  Date Time Provider Department Center  10/07/2024 10:15 AM Izell Harari, MD [facility]    Harari Izell Raddle MD Attending [facility])

## 2024-09-30 LAB — CYTOLOGY - PAP
Adequacy: ABSENT
Comment: NEGATIVE
Diagnosis: NEGATIVE
High risk HPV: NEGATIVE

## 2024-10-01 ENCOUNTER — Ambulatory Visit: Payer: Self-pay | Admitting: Obstetrics and Gynecology

## 2024-10-07 ENCOUNTER — Ambulatory Visit: Admitting: Obstetrics and Gynecology
